# Patient Record
Sex: Female | Born: 1940 | Race: White | Hispanic: No | Marital: Single | Smoking: Never smoker
Health system: Southern US, Community
[De-identification: ages and names within clinical notes are randomized; demographics above are authoritative.]

## PROBLEM LIST (undated history)

## (undated) DIAGNOSIS — I10 Essential (primary) hypertension: Secondary | ICD-10-CM

## (undated) DIAGNOSIS — K2100 Gastro-esophageal reflux disease with esophagitis, without bleeding: Secondary | ICD-10-CM

## (undated) HISTORY — PX: THORACIC DISC SURGERY: SHX801

## (undated) HISTORY — PX: FOOT SURGERY: SHX648

---

## 2015-04-03 DIAGNOSIS — Z5181 Encounter for therapeutic drug level monitoring: Secondary | ICD-10-CM | POA: Diagnosis not present

## 2015-04-09 DIAGNOSIS — M533 Sacrococcygeal disorders, not elsewhere classified: Secondary | ICD-10-CM | POA: Diagnosis not present

## 2015-04-11 DIAGNOSIS — Z79899 Other long term (current) drug therapy: Secondary | ICD-10-CM | POA: Diagnosis not present

## 2015-04-11 DIAGNOSIS — M84374A Stress fracture, right foot, initial encounter for fracture: Secondary | ICD-10-CM | POA: Diagnosis not present

## 2015-04-11 DIAGNOSIS — Z7982 Long term (current) use of aspirin: Secondary | ICD-10-CM | POA: Diagnosis not present

## 2015-04-11 DIAGNOSIS — M722 Plantar fascial fibromatosis: Secondary | ICD-10-CM | POA: Diagnosis not present

## 2015-04-11 DIAGNOSIS — M79671 Pain in right foot: Secondary | ICD-10-CM | POA: Diagnosis not present

## 2015-04-11 DIAGNOSIS — Z885 Allergy status to narcotic agent status: Secondary | ICD-10-CM | POA: Diagnosis not present

## 2015-04-11 DIAGNOSIS — M7731 Calcaneal spur, right foot: Secondary | ICD-10-CM | POA: Diagnosis not present

## 2015-04-11 DIAGNOSIS — M19071 Primary osteoarthritis, right ankle and foot: Secondary | ICD-10-CM | POA: Diagnosis not present

## 2015-04-11 DIAGNOSIS — I10 Essential (primary) hypertension: Secondary | ICD-10-CM | POA: Diagnosis not present

## 2015-04-11 DIAGNOSIS — Z87891 Personal history of nicotine dependence: Secondary | ICD-10-CM | POA: Diagnosis not present

## 2015-04-16 DIAGNOSIS — Z87891 Personal history of nicotine dependence: Secondary | ICD-10-CM | POA: Diagnosis not present

## 2015-04-16 DIAGNOSIS — M84374A Stress fracture, right foot, initial encounter for fracture: Secondary | ICD-10-CM | POA: Diagnosis not present

## 2015-04-16 DIAGNOSIS — X58XXXD Exposure to other specified factors, subsequent encounter: Secondary | ICD-10-CM | POA: Diagnosis not present

## 2015-04-16 DIAGNOSIS — S62602K Fracture of unspecified phalanx of right middle finger, subsequent encounter for fracture with nonunion: Secondary | ICD-10-CM | POA: Diagnosis not present

## 2015-04-16 DIAGNOSIS — S92321K Displaced fracture of second metatarsal bone, right foot, subsequent encounter for fracture with nonunion: Secondary | ICD-10-CM | POA: Diagnosis not present

## 2015-04-16 DIAGNOSIS — M84374K Stress fracture, right foot, subsequent encounter for fracture with nonunion: Secondary | ICD-10-CM | POA: Diagnosis not present

## 2015-04-16 DIAGNOSIS — F419 Anxiety disorder, unspecified: Secondary | ICD-10-CM | POA: Diagnosis not present

## 2015-05-01 DIAGNOSIS — S92321D Displaced fracture of second metatarsal bone, right foot, subsequent encounter for fracture with routine healing: Secondary | ICD-10-CM | POA: Diagnosis not present

## 2015-05-01 DIAGNOSIS — M84374A Stress fracture, right foot, initial encounter for fracture: Secondary | ICD-10-CM | POA: Diagnosis not present

## 2015-05-01 DIAGNOSIS — X58XXXD Exposure to other specified factors, subsequent encounter: Secondary | ICD-10-CM | POA: Diagnosis not present

## 2015-05-24 DIAGNOSIS — Z4789 Encounter for other orthopedic aftercare: Secondary | ICD-10-CM | POA: Diagnosis not present

## 2015-05-24 DIAGNOSIS — Z7982 Long term (current) use of aspirin: Secondary | ICD-10-CM | POA: Diagnosis not present

## 2015-05-24 DIAGNOSIS — Z79899 Other long term (current) drug therapy: Secondary | ICD-10-CM | POA: Diagnosis not present

## 2015-05-24 DIAGNOSIS — X58XXXD Exposure to other specified factors, subsequent encounter: Secondary | ICD-10-CM | POA: Diagnosis not present

## 2015-05-24 DIAGNOSIS — Z87891 Personal history of nicotine dependence: Secondary | ICD-10-CM | POA: Diagnosis not present

## 2015-05-24 DIAGNOSIS — I10 Essential (primary) hypertension: Secondary | ICD-10-CM | POA: Diagnosis not present

## 2015-05-24 DIAGNOSIS — M84374K Stress fracture, right foot, subsequent encounter for fracture with nonunion: Secondary | ICD-10-CM | POA: Diagnosis not present

## 2015-05-24 DIAGNOSIS — Z885 Allergy status to narcotic agent status: Secondary | ICD-10-CM | POA: Diagnosis not present

## 2015-06-20 DIAGNOSIS — M5136 Other intervertebral disc degeneration, lumbar region: Secondary | ICD-10-CM | POA: Diagnosis not present

## 2015-06-20 DIAGNOSIS — M533 Sacrococcygeal disorders, not elsewhere classified: Secondary | ICD-10-CM | POA: Diagnosis not present

## 2015-06-20 DIAGNOSIS — M1288 Other specific arthropathies, not elsewhere classified, other specified site: Secondary | ICD-10-CM | POA: Diagnosis not present

## 2015-06-21 DIAGNOSIS — Z87891 Personal history of nicotine dependence: Secondary | ICD-10-CM | POA: Diagnosis not present

## 2015-06-21 DIAGNOSIS — Z4789 Encounter for other orthopedic aftercare: Secondary | ICD-10-CM | POA: Diagnosis not present

## 2015-06-21 DIAGNOSIS — Z7982 Long term (current) use of aspirin: Secondary | ICD-10-CM | POA: Diagnosis not present

## 2015-06-21 DIAGNOSIS — X58XXXD Exposure to other specified factors, subsequent encounter: Secondary | ICD-10-CM | POA: Diagnosis not present

## 2015-06-21 DIAGNOSIS — Z885 Allergy status to narcotic agent status: Secondary | ICD-10-CM | POA: Diagnosis not present

## 2015-06-21 DIAGNOSIS — M84374K Stress fracture, right foot, subsequent encounter for fracture with nonunion: Secondary | ICD-10-CM | POA: Diagnosis not present

## 2015-06-21 DIAGNOSIS — Z79899 Other long term (current) drug therapy: Secondary | ICD-10-CM | POA: Diagnosis not present

## 2015-06-21 DIAGNOSIS — I10 Essential (primary) hypertension: Secondary | ICD-10-CM | POA: Diagnosis not present

## 2015-07-19 DIAGNOSIS — Z7982 Long term (current) use of aspirin: Secondary | ICD-10-CM | POA: Diagnosis not present

## 2015-07-19 DIAGNOSIS — Z79891 Long term (current) use of opiate analgesic: Secondary | ICD-10-CM | POA: Diagnosis not present

## 2015-07-19 DIAGNOSIS — Z09 Encounter for follow-up examination after completed treatment for conditions other than malignant neoplasm: Secondary | ICD-10-CM | POA: Diagnosis not present

## 2015-07-19 DIAGNOSIS — Z8781 Personal history of (healed) traumatic fracture: Secondary | ICD-10-CM | POA: Diagnosis not present

## 2015-07-19 DIAGNOSIS — Z885 Allergy status to narcotic agent status: Secondary | ICD-10-CM | POA: Diagnosis not present

## 2015-07-19 DIAGNOSIS — I10 Essential (primary) hypertension: Secondary | ICD-10-CM | POA: Diagnosis not present

## 2015-07-19 DIAGNOSIS — Z79899 Other long term (current) drug therapy: Secondary | ICD-10-CM | POA: Diagnosis not present

## 2015-07-19 DIAGNOSIS — M84374K Stress fracture, right foot, subsequent encounter for fracture with nonunion: Secondary | ICD-10-CM | POA: Diagnosis not present

## 2015-07-19 DIAGNOSIS — Z4789 Encounter for other orthopedic aftercare: Secondary | ICD-10-CM | POA: Diagnosis not present

## 2015-07-19 DIAGNOSIS — Z87891 Personal history of nicotine dependence: Secondary | ICD-10-CM | POA: Diagnosis not present

## 2015-07-19 DIAGNOSIS — Z9889 Other specified postprocedural states: Secondary | ICD-10-CM | POA: Diagnosis not present

## 2015-08-30 DIAGNOSIS — K219 Gastro-esophageal reflux disease without esophagitis: Secondary | ICD-10-CM | POA: Diagnosis not present

## 2015-08-30 DIAGNOSIS — F411 Generalized anxiety disorder: Secondary | ICD-10-CM | POA: Diagnosis not present

## 2015-08-30 DIAGNOSIS — F329 Major depressive disorder, single episode, unspecified: Secondary | ICD-10-CM | POA: Diagnosis not present

## 2015-08-30 DIAGNOSIS — I10 Essential (primary) hypertension: Secondary | ICD-10-CM | POA: Diagnosis not present

## 2015-08-30 DIAGNOSIS — M8580 Other specified disorders of bone density and structure, unspecified site: Secondary | ICD-10-CM | POA: Diagnosis not present

## 2015-09-19 DIAGNOSIS — M5136 Other intervertebral disc degeneration, lumbar region: Secondary | ICD-10-CM | POA: Diagnosis not present

## 2015-09-19 DIAGNOSIS — M533 Sacrococcygeal disorders, not elsewhere classified: Secondary | ICD-10-CM | POA: Diagnosis not present

## 2015-09-19 DIAGNOSIS — M1288 Other specific arthropathies, not elsewhere classified, other specified site: Secondary | ICD-10-CM | POA: Diagnosis not present

## 2015-11-26 DIAGNOSIS — Z87891 Personal history of nicotine dependence: Secondary | ICD-10-CM | POA: Diagnosis not present

## 2015-11-26 DIAGNOSIS — Z7982 Long term (current) use of aspirin: Secondary | ICD-10-CM | POA: Diagnosis not present

## 2015-11-26 DIAGNOSIS — Z79899 Other long term (current) drug therapy: Secondary | ICD-10-CM | POA: Diagnosis not present

## 2015-11-26 DIAGNOSIS — M79671 Pain in right foot: Secondary | ICD-10-CM | POA: Diagnosis not present

## 2015-11-26 DIAGNOSIS — M84374D Stress fracture, right foot, subsequent encounter for fracture with routine healing: Secondary | ICD-10-CM | POA: Diagnosis not present

## 2015-11-26 DIAGNOSIS — Z4789 Encounter for other orthopedic aftercare: Secondary | ICD-10-CM | POA: Diagnosis not present

## 2015-11-26 DIAGNOSIS — Z885 Allergy status to narcotic agent status: Secondary | ICD-10-CM | POA: Diagnosis not present

## 2015-11-26 DIAGNOSIS — M722 Plantar fascial fibromatosis: Secondary | ICD-10-CM | POA: Diagnosis not present

## 2015-11-26 DIAGNOSIS — M84374K Stress fracture, right foot, subsequent encounter for fracture with nonunion: Secondary | ICD-10-CM | POA: Diagnosis not present

## 2015-11-26 DIAGNOSIS — I10 Essential (primary) hypertension: Secondary | ICD-10-CM | POA: Diagnosis not present

## 2015-12-09 DIAGNOSIS — I129 Hypertensive chronic kidney disease with stage 1 through stage 4 chronic kidney disease, or unspecified chronic kidney disease: Secondary | ICD-10-CM | POA: Diagnosis not present

## 2015-12-09 DIAGNOSIS — N183 Chronic kidney disease, stage 3 (moderate): Secondary | ICD-10-CM | POA: Diagnosis not present

## 2015-12-09 DIAGNOSIS — F411 Generalized anxiety disorder: Secondary | ICD-10-CM | POA: Diagnosis not present

## 2015-12-09 DIAGNOSIS — I482 Chronic atrial fibrillation: Secondary | ICD-10-CM | POA: Diagnosis not present

## 2015-12-09 DIAGNOSIS — F3341 Major depressive disorder, recurrent, in partial remission: Secondary | ICD-10-CM | POA: Diagnosis not present

## 2015-12-09 DIAGNOSIS — M461 Sacroiliitis, not elsewhere classified: Secondary | ICD-10-CM | POA: Diagnosis not present

## 2015-12-09 DIAGNOSIS — M5417 Radiculopathy, lumbosacral region: Secondary | ICD-10-CM | POA: Diagnosis not present

## 2015-12-09 DIAGNOSIS — Z23 Encounter for immunization: Secondary | ICD-10-CM | POA: Diagnosis not present

## 2015-12-27 DIAGNOSIS — M533 Sacrococcygeal disorders, not elsewhere classified: Secondary | ICD-10-CM | POA: Diagnosis not present

## 2015-12-27 DIAGNOSIS — M1288 Other specific arthropathies, not elsewhere classified, other specified site: Secondary | ICD-10-CM | POA: Diagnosis not present

## 2015-12-27 DIAGNOSIS — M5136 Other intervertebral disc degeneration, lumbar region: Secondary | ICD-10-CM | POA: Diagnosis not present

## 2015-12-27 DIAGNOSIS — Z5181 Encounter for therapeutic drug level monitoring: Secondary | ICD-10-CM | POA: Diagnosis not present

## 2016-01-31 DIAGNOSIS — M84374A Stress fracture, right foot, initial encounter for fracture: Secondary | ICD-10-CM | POA: Diagnosis not present

## 2016-01-31 DIAGNOSIS — Z885 Allergy status to narcotic agent status: Secondary | ICD-10-CM | POA: Diagnosis not present

## 2016-01-31 DIAGNOSIS — I129 Hypertensive chronic kidney disease with stage 1 through stage 4 chronic kidney disease, or unspecified chronic kidney disease: Secondary | ICD-10-CM | POA: Diagnosis not present

## 2016-01-31 DIAGNOSIS — Z7982 Long term (current) use of aspirin: Secondary | ICD-10-CM | POA: Diagnosis not present

## 2016-01-31 DIAGNOSIS — N183 Chronic kidney disease, stage 3 (moderate): Secondary | ICD-10-CM | POA: Diagnosis not present

## 2016-01-31 DIAGNOSIS — M79671 Pain in right foot: Secondary | ICD-10-CM | POA: Diagnosis not present

## 2016-01-31 DIAGNOSIS — M85871 Other specified disorders of bone density and structure, right ankle and foot: Secondary | ICD-10-CM | POA: Diagnosis not present

## 2016-01-31 DIAGNOSIS — X58XXXA Exposure to other specified factors, initial encounter: Secondary | ICD-10-CM | POA: Diagnosis not present

## 2016-01-31 DIAGNOSIS — Z79899 Other long term (current) drug therapy: Secondary | ICD-10-CM | POA: Diagnosis not present

## 2016-05-13 DIAGNOSIS — R29898 Other symptoms and signs involving the musculoskeletal system: Secondary | ICD-10-CM | POA: Diagnosis not present

## 2016-05-13 DIAGNOSIS — G5790 Unspecified mononeuropathy of unspecified lower limb: Secondary | ICD-10-CM | POA: Diagnosis not present

## 2016-05-13 DIAGNOSIS — M79671 Pain in right foot: Secondary | ICD-10-CM | POA: Diagnosis not present

## 2016-05-13 DIAGNOSIS — M19079 Primary osteoarthritis, unspecified ankle and foot: Secondary | ICD-10-CM | POA: Diagnosis not present

## 2016-07-30 ENCOUNTER — Encounter (HOSPITAL_COMMUNITY)
Admission: EM | Disposition: A | Discharge status: Home / Self Care | Payer: Self-pay | Source: Home / Self Care | Attending: Internal Medicine

## 2016-07-30 ENCOUNTER — Inpatient Hospital Stay (HOSPITAL_COMMUNITY): Payer: PPO

## 2016-07-30 ENCOUNTER — Inpatient Hospital Stay (HOSPITAL_COMMUNITY): Payer: PPO | Admitting: Certified Registered Nurse Anesthetist

## 2016-07-30 ENCOUNTER — Inpatient Hospital Stay (HOSPITAL_BASED_OUTPATIENT_CLINIC_OR_DEPARTMENT_OTHER)
Admission: EM | Admit: 2016-07-30 | Discharge: 2016-08-01 | DRG: 482 | Disposition: A | Discharge status: Home / Self Care | Payer: PPO | Attending: Internal Medicine | Admitting: Internal Medicine

## 2016-07-30 ENCOUNTER — Emergency Department (HOSPITAL_BASED_OUTPATIENT_CLINIC_OR_DEPARTMENT_OTHER): Payer: PPO

## 2016-07-30 ENCOUNTER — Encounter (HOSPITAL_BASED_OUTPATIENT_CLINIC_OR_DEPARTMENT_OTHER): Payer: Self-pay | Admitting: *Deleted

## 2016-07-30 DIAGNOSIS — I480 Paroxysmal atrial fibrillation: Secondary | ICD-10-CM | POA: Diagnosis not present

## 2016-07-30 DIAGNOSIS — W1830XA Fall on same level, unspecified, initial encounter: Secondary | ICD-10-CM | POA: Diagnosis present

## 2016-07-30 DIAGNOSIS — E876 Hypokalemia: Secondary | ICD-10-CM | POA: Diagnosis not present

## 2016-07-30 DIAGNOSIS — R51 Headache: Secondary | ICD-10-CM | POA: Diagnosis present

## 2016-07-30 DIAGNOSIS — M1631 Unilateral osteoarthritis resulting from hip dysplasia, right hip: Secondary | ICD-10-CM | POA: Diagnosis not present

## 2016-07-30 DIAGNOSIS — S72091A Other fracture of head and neck of right femur, initial encounter for closed fracture: Secondary | ICD-10-CM | POA: Diagnosis not present

## 2016-07-30 DIAGNOSIS — I129 Hypertensive chronic kidney disease with stage 1 through stage 4 chronic kidney disease, or unspecified chronic kidney disease: Secondary | ICD-10-CM | POA: Diagnosis not present

## 2016-07-30 DIAGNOSIS — N183 Chronic kidney disease, stage 3 unspecified: Secondary | ICD-10-CM | POA: Diagnosis present

## 2016-07-30 DIAGNOSIS — Z8249 Family history of ischemic heart disease and other diseases of the circulatory system: Secondary | ICD-10-CM | POA: Diagnosis not present

## 2016-07-30 DIAGNOSIS — M25551 Pain in right hip: Secondary | ICD-10-CM | POA: Diagnosis not present

## 2016-07-30 DIAGNOSIS — K210 Gastro-esophageal reflux disease with esophagitis: Secondary | ICD-10-CM | POA: Diagnosis present

## 2016-07-30 DIAGNOSIS — F419 Anxiety disorder, unspecified: Secondary | ICD-10-CM | POA: Diagnosis present

## 2016-07-30 DIAGNOSIS — S72011A Unspecified intracapsular fracture of right femur, initial encounter for closed fracture: Secondary | ICD-10-CM | POA: Diagnosis not present

## 2016-07-30 DIAGNOSIS — I10 Essential (primary) hypertension: Secondary | ICD-10-CM | POA: Diagnosis not present

## 2016-07-30 DIAGNOSIS — F329 Major depressive disorder, single episode, unspecified: Secondary | ICD-10-CM | POA: Diagnosis not present

## 2016-07-30 DIAGNOSIS — R52 Pain, unspecified: Secondary | ICD-10-CM

## 2016-07-30 DIAGNOSIS — Z419 Encounter for procedure for purposes other than remedying health state, unspecified: Secondary | ICD-10-CM

## 2016-07-30 DIAGNOSIS — D72829 Elevated white blood cell count, unspecified: Secondary | ICD-10-CM | POA: Diagnosis not present

## 2016-07-30 DIAGNOSIS — S72009A Fracture of unspecified part of neck of unspecified femur, initial encounter for closed fracture: Secondary | ICD-10-CM | POA: Diagnosis present

## 2016-07-30 DIAGNOSIS — S72001A Fracture of unspecified part of neck of right femur, initial encounter for closed fracture: Secondary | ICD-10-CM | POA: Diagnosis not present

## 2016-07-30 DIAGNOSIS — Z885 Allergy status to narcotic agent status: Secondary | ICD-10-CM | POA: Diagnosis not present

## 2016-07-30 DIAGNOSIS — Z79899 Other long term (current) drug therapy: Secondary | ICD-10-CM | POA: Diagnosis not present

## 2016-07-30 DIAGNOSIS — D500 Iron deficiency anemia secondary to blood loss (chronic): Secondary | ICD-10-CM | POA: Diagnosis present

## 2016-07-30 HISTORY — DX: Essential (primary) hypertension: I10

## 2016-07-30 HISTORY — DX: Gastro-esophageal reflux disease with esophagitis: K21.0

## 2016-07-30 HISTORY — DX: Gastro-esophageal reflux disease with esophagitis, without bleeding: K21.00

## 2016-07-30 HISTORY — PX: HIP PINNING,CANNULATED: SHX1758

## 2016-07-30 LAB — CBC WITH DIFFERENTIAL/PLATELET
BASOS ABS: 0 10*3/uL (ref 0.0–0.1)
Basophils Relative: 0 %
EOS ABS: 0.1 10*3/uL (ref 0.0–0.7)
EOS PCT: 1 %
HCT: 37 % (ref 36.0–46.0)
Hemoglobin: 12.9 g/dL (ref 12.0–15.0)
LYMPHS PCT: 29 %
Lymphs Abs: 2.2 10*3/uL (ref 0.7–4.0)
MCH: 32.5 pg (ref 26.0–34.0)
MCHC: 34.9 g/dL (ref 30.0–36.0)
MCV: 93.2 fL (ref 78.0–100.0)
MONO ABS: 0.5 10*3/uL (ref 0.1–1.0)
Monocytes Relative: 6 %
Neutro Abs: 4.7 10*3/uL (ref 1.7–7.7)
Neutrophils Relative %: 64 %
PLATELETS: 288 10*3/uL (ref 150–400)
RBC: 3.97 MIL/uL (ref 3.87–5.11)
RDW: 12.1 % (ref 11.5–15.5)
WBC: 7.4 10*3/uL (ref 4.0–10.5)

## 2016-07-30 LAB — BASIC METABOLIC PANEL
Anion gap: 9 (ref 5–15)
BUN: 33 mg/dL — ABNORMAL HIGH (ref 6–20)
CALCIUM: 10 mg/dL (ref 8.9–10.3)
CO2: 26 mmol/L (ref 22–32)
CREATININE: 1.51 mg/dL — AB (ref 0.44–1.00)
Chloride: 100 mmol/L — ABNORMAL LOW (ref 101–111)
GFR calc Af Amer: 38 mL/min — ABNORMAL LOW (ref >60)
GFR, EST NON AFRICAN AMERICAN: 33 mL/min — AB (ref >60)
GLUCOSE: 103 mg/dL — AB (ref 65–99)
Potassium: 3.1 mmol/L — ABNORMAL LOW (ref 3.5–5.1)
SODIUM: 135 mmol/L (ref 135–145)

## 2016-07-30 LAB — PROTIME-INR
INR: 1
PROTHROMBIN TIME: 13.2 s (ref 11.4–15.2)

## 2016-07-30 SURGERY — FIXATION, FEMUR, NECK, PERCUTANEOUS, USING SCREW
Anesthesia: General | Laterality: Right

## 2016-07-30 MED ORDER — NEOSTIGMINE METHYLSULFATE 5 MG/5ML IV SOSY
PREFILLED_SYRINGE | INTRAVENOUS | Status: AC
  Filled 2016-07-30: qty 5

## 2016-07-30 MED ORDER — METHOCARBAMOL 500 MG PO TABS: 500.0000 mg | ORAL_TABLET | Freq: Four times a day (QID) | ORAL | Status: DC | PRN

## 2016-07-30 MED ORDER — PROPOFOL 10 MG/ML IV BOLUS
INTRAVENOUS | Status: AC
  Filled 2016-07-30: qty 20

## 2016-07-30 MED ORDER — LIDOCAINE 2% (20 MG/ML) 5 ML SYRINGE
INTRAMUSCULAR | Status: DC | PRN
  Administered 2016-07-30: 60 mg via INTRAVENOUS

## 2016-07-30 MED ORDER — NEOSTIGMINE METHYLSULFATE 10 MG/10ML IV SOLN
INTRAVENOUS | Status: DC | PRN
  Administered 2016-07-30: 2 mg via INTRAVENOUS

## 2016-07-30 MED ORDER — HYDROCODONE-ACETAMINOPHEN 5-325 MG PO TABS
ORAL_TABLET | ORAL | Status: AC
  Filled 2016-07-30: qty 1

## 2016-07-30 MED ORDER — FENTANYL CITRATE (PF) 100 MCG/2ML IJ SOLN
INTRAMUSCULAR | Status: AC
Start: 2016-07-30 — End: 2016-07-31
  Filled 2016-07-30: qty 2

## 2016-07-30 MED ORDER — GLYCOPYRROLATE 0.2 MG/ML IV SOSY
PREFILLED_SYRINGE | INTRAVENOUS | Status: AC
  Filled 2016-07-30: qty 5

## 2016-07-30 MED ORDER — ROCURONIUM BROMIDE 10 MG/ML (PF) SYRINGE
PREFILLED_SYRINGE | INTRAVENOUS | Status: DC | PRN
  Administered 2016-07-30: 20 mg via INTRAVENOUS

## 2016-07-30 MED ORDER — ONDANSETRON HCL 4 MG PO TABS
4.0000 mg | ORAL_TABLET | Freq: Four times a day (QID) | ORAL | Status: DC | PRN
  Filled 2016-07-30: qty 1

## 2016-07-30 MED ORDER — ONDANSETRON HCL 4 MG/2ML IJ SOLN
INTRAMUSCULAR | Status: DC | PRN
  Administered 2016-07-30: 4 mg via INTRAVENOUS

## 2016-07-30 MED ORDER — METHOCARBAMOL 1000 MG/10ML IJ SOLN
500.0000 mg | Freq: Four times a day (QID) | INTRAVENOUS | Status: DC | PRN
  Administered 2016-07-30: 500 mg via INTRAVENOUS
  Filled 2016-07-30: qty 550

## 2016-07-30 MED ORDER — ACETAMINOPHEN 325 MG PO TABS: 650.0000 mg | ORAL_TABLET | Freq: Four times a day (QID) | ORAL | Status: DC | PRN

## 2016-07-30 MED ORDER — FENTANYL CITRATE (PF) 100 MCG/2ML IJ SOLN
50.0000 ug | Freq: Once | INTRAMUSCULAR | Status: AC
  Administered 2016-07-30: 50 ug via INTRAVENOUS
  Filled 2016-07-30: qty 2

## 2016-07-30 MED ORDER — FENTANYL CITRATE (PF) 100 MCG/2ML IJ SOLN
INTRAMUSCULAR | Status: DC | PRN
  Administered 2016-07-30 (×2): 50 ug via INTRAVENOUS

## 2016-07-30 MED ORDER — ONDANSETRON HCL 4 MG/2ML IJ SOLN
INTRAMUSCULAR | Status: AC
  Filled 2016-07-30: qty 2

## 2016-07-30 MED ORDER — GLYCOPYRROLATE 0.2 MG/ML IV SOSY
PREFILLED_SYRINGE | INTRAVENOUS | Status: DC | PRN
  Administered 2016-07-30 (×2): .2 mg via INTRAVENOUS

## 2016-07-30 MED ORDER — HYDRALAZINE HCL 20 MG/ML IJ SOLN: 10.0000 mg | INTRAMUSCULAR | Status: DC | PRN

## 2016-07-30 MED ORDER — FENTANYL CITRATE (PF) 100 MCG/2ML IJ SOLN
INTRAMUSCULAR | Status: AC
  Filled 2016-07-30: qty 2

## 2016-07-30 MED ORDER — SUCCINYLCHOLINE CHLORIDE 200 MG/10ML IV SOSY
PREFILLED_SYRINGE | INTRAVENOUS | Status: DC | PRN
  Administered 2016-07-30: 100 mg via INTRAVENOUS

## 2016-07-30 MED ORDER — ACETAMINOPHEN 650 MG RE SUPP
650.0000 mg | Freq: Four times a day (QID) | RECTAL | Status: DC | PRN
  Filled 2016-07-30: qty 1

## 2016-07-30 MED ORDER — CEFAZOLIN SODIUM-DEXTROSE 2-3 GM-% IV SOLR
INTRAVENOUS | Status: DC | PRN
  Administered 2016-07-30: 2 g via INTRAVENOUS

## 2016-07-30 MED ORDER — MAGNESIUM CITRATE PO SOLN: 1.0000 | Freq: Once | ORAL | Status: DC | PRN

## 2016-07-30 MED ORDER — LACTATED RINGERS IV SOLN
INTRAVENOUS | Status: DC | PRN
  Administered 2016-07-30 (×2): via INTRAVENOUS

## 2016-07-30 MED ORDER — DEXAMETHASONE SODIUM PHOSPHATE 10 MG/ML IJ SOLN
INTRAMUSCULAR | Status: AC
  Filled 2016-07-30: qty 1

## 2016-07-30 MED ORDER — DEXAMETHASONE SODIUM PHOSPHATE 10 MG/ML IJ SOLN
INTRAMUSCULAR | Status: DC | PRN
  Administered 2016-07-30: 10 mg via INTRAVENOUS

## 2016-07-30 MED ORDER — DOCUSATE SODIUM 100 MG PO CAPS
100.0000 mg | ORAL_CAPSULE | Freq: Two times a day (BID) | ORAL | Status: DC
  Administered 2016-07-31 – 2016-08-01 (×4): 100 mg via ORAL
  Filled 2016-07-30 (×5): qty 1

## 2016-07-30 MED ORDER — EPHEDRINE 5 MG/ML INJ
INTRAVENOUS | Status: AC
  Filled 2016-07-30: qty 10

## 2016-07-30 MED ORDER — ROCURONIUM BROMIDE 50 MG/5ML IV SOSY
PREFILLED_SYRINGE | INTRAVENOUS | Status: AC
  Filled 2016-07-30: qty 5

## 2016-07-30 MED ORDER — CEFAZOLIN SODIUM-DEXTROSE 2-4 GM/100ML-% IV SOLN
INTRAVENOUS | Status: AC
  Filled 2016-07-30: qty 100

## 2016-07-30 MED ORDER — HYDROCODONE-ACETAMINOPHEN 5-325 MG PO TABS
1.0000 | ORAL_TABLET | Freq: Four times a day (QID) | ORAL | Status: DC | PRN
  Administered 2016-07-30: 1 via ORAL
  Administered 2016-07-31 – 2016-08-01 (×4): 2 via ORAL
  Filled 2016-07-30 (×4): qty 2

## 2016-07-30 MED ORDER — POLYETHYLENE GLYCOL 3350 17 G PO PACK: 17.0000 g | PACK | Freq: Every day | ORAL | Status: DC | PRN

## 2016-07-30 MED ORDER — SUGAMMADEX SODIUM 200 MG/2ML IV SOLN
INTRAVENOUS | Status: AC
  Filled 2016-07-30: qty 2

## 2016-07-30 MED ORDER — PROPOFOL 10 MG/ML IV BOLUS
INTRAVENOUS | Status: DC | PRN
  Administered 2016-07-30: 130 mg via INTRAVENOUS

## 2016-07-30 MED ORDER — HYDROCODONE-ACETAMINOPHEN 5-325 MG PO TABS: 1.0000 | ORAL_TABLET | Freq: Four times a day (QID) | ORAL | 0 refills | Status: DC | PRN

## 2016-07-30 MED ORDER — CEFAZOLIN SODIUM-DEXTROSE 2-4 GM/100ML-% IV SOLN
2.0000 g | Freq: Four times a day (QID) | INTRAVENOUS | Status: AC
  Administered 2016-07-31 (×2): 2 g via INTRAVENOUS
  Filled 2016-07-30 (×2): qty 100

## 2016-07-30 MED ORDER — ONDANSETRON HCL 4 MG/2ML IJ SOLN: 4.0000 mg | Freq: Four times a day (QID) | INTRAMUSCULAR | Status: DC | PRN

## 2016-07-30 MED ORDER — SODIUM CHLORIDE 0.9 % IV BOLUS (SEPSIS)
1000.0000 mL | Freq: Once | INTRAVENOUS | Status: AC
  Administered 2016-07-30: 1000 mL via INTRAVENOUS

## 2016-07-30 MED ORDER — PROMETHAZINE HCL 25 MG/ML IJ SOLN
6.2500 mg | INTRAMUSCULAR | Status: DC | PRN
Start: 2016-07-30 — End: 2016-07-31

## 2016-07-30 MED ORDER — FENTANYL CITRATE (PF) 100 MCG/2ML IJ SOLN
25.0000 ug | INTRAMUSCULAR | Status: DC | PRN
Start: 2016-07-30 — End: 2016-07-31
  Administered 2016-07-30: 25 ug via INTRAVENOUS
  Administered 2016-07-30: 50 ug via INTRAVENOUS
  Administered 2016-07-30: 25 ug via INTRAVENOUS
  Administered 2016-07-30: 50 ug via INTRAVENOUS

## 2016-07-30 MED ORDER — EPHEDRINE SULFATE 50 MG/ML IJ SOLN
INTRAMUSCULAR | Status: DC | PRN
  Administered 2016-07-30 (×4): 10 mg via INTRAVENOUS

## 2016-07-30 MED ORDER — ASPIRIN EC 325 MG PO TBEC: 325.0000 mg | DELAYED_RELEASE_TABLET | Freq: Every day | ORAL | 0 refills | Status: DC

## 2016-07-30 MED ORDER — BISACODYL 5 MG PO TBEC: 5.0000 mg | DELAYED_RELEASE_TABLET | Freq: Every day | ORAL | Status: DC | PRN

## 2016-07-30 SURGICAL SUPPLY — 34 items
BAG ZIPLOCK 12X15 (MISCELLANEOUS) IMPLANT
BIT DRILL 4.8X300 (BIT) ×2 IMPLANT
BIT DRILL 4.8X300MM (BIT) ×1
BNDG GAUZE ELAST 4 BULKY (GAUZE/BANDAGES/DRESSINGS) IMPLANT
CLOSURE WOUND 1/2 X4 (GAUZE/BANDAGES/DRESSINGS) ×1
DRAPE STERI IOBAN 125X83 (DRAPES) ×3 IMPLANT
DRSG EMULSION OIL 3X16 NADH (GAUZE/BANDAGES/DRESSINGS) IMPLANT
DRSG MEPILEX BORDER 4X4 (GAUZE/BANDAGES/DRESSINGS) ×3 IMPLANT
DRSG PAD ABDOMINAL 8X10 ST (GAUZE/BANDAGES/DRESSINGS) IMPLANT
DURAPREP 26ML APPLICATOR (WOUND CARE) ×3 IMPLANT
ELECT REM PT RETURN 15FT ADLT (MISCELLANEOUS) ×3 IMPLANT
GAUZE SPONGE 4X4 12PLY STRL (GAUZE/BANDAGES/DRESSINGS) IMPLANT
GLOVE BIO SURGEON STRL SZ8 (GLOVE) ×3 IMPLANT
GLOVE ECLIPSE 7.5 STRL STRAW (GLOVE) ×18 IMPLANT
MANIFOLD NEPTUNE II (INSTRUMENTS) ×3 IMPLANT
NS IRRIG 1000ML POUR BTL (IV SOLUTION) ×3 IMPLANT
PACK GENERAL/GYN (CUSTOM PROCEDURE TRAY) ×3 IMPLANT
PAD CAST 4YDX4 CTTN HI CHSV (CAST SUPPLIES) IMPLANT
PADDING CAST COTTON 4X4 STRL (CAST SUPPLIES)
PIN GUIDE DRILL TIP 2.8X300 (DRILL) ×9 IMPLANT
POSITIONER SURGICAL ARM (MISCELLANEOUS) ×3 IMPLANT
SCREW CANN 6.5MMX100MMX16MM ×3 IMPLANT
SCREW CANN 8.0X85 HIP (Screw) ×3 IMPLANT
SCREW CANN FT 95X8 NS LNG (Screw) ×1 IMPLANT
SCREW CANNULATED 8.0X95 (Screw) ×2 IMPLANT
SPONGE LAP 18X18 X RAY DECT (DISPOSABLE) IMPLANT
STAPLER VISISTAT 35W (STAPLE) ×3 IMPLANT
STRIP CLOSURE SKIN 1/2X4 (GAUZE/BANDAGES/DRESSINGS) ×2 IMPLANT
SUT MNCRL AB 3-0 PS2 18 (SUTURE) ×3 IMPLANT
SUT VIC AB 1 CT1 36 (SUTURE) ×3 IMPLANT
SUT VIC AB 2-0 CT1 27 (SUTURE) ×2
SUT VIC AB 2-0 CT1 TAPERPNT 27 (SUTURE) ×1 IMPLANT
TRAY FOLEY W/METER SILVER 16FR (SET/KITS/TRAYS/PACK) IMPLANT
WATER STERILE IRR 1500ML POUR (IV SOLUTION) IMPLANT

## 2016-07-30 NOTE — Transfer of Care (Signed)
Immediate Anesthesia Transfer of Care Note  Patient: Jamie PhilipsMary Clevinger  Procedure(s) Performed: Procedure(s): CANNULATED HIP PINNING (Right)  Patient Location: PACU  Anesthesia Type:General  Level of Consciousness:  sedated, patient cooperative and responds to stimulation  Airway & Oxygen Therapy:Patient Spontanous Breathing and Patient connected to face mask oxgen  Post-op Assessment:  Report given to PACU RN and Post -op Vital signs reviewed and stable  Post vital signs:  Reviewed and stable  Last Vitals:  Vitals:   07/30/16 1839 07/30/16 2230  BP: (!) 159/68   Pulse: 72   Resp: 17 17  Temp:  36.6 C    Complications: No apparent anesthesia complications

## 2016-07-30 NOTE — Anesthesia Preprocedure Evaluation (Addendum)
Anesthesia Evaluation  Patient identified by MRN, date of birth, ID band Patient awake    Reviewed: Allergy & Precautions, NPO status , Patient's Chart, lab work & pertinent test results  Airway Mallampati: II  TM Distance: >3 FB Neck ROM: Full    Dental  (+) Dental Advisory Given   Pulmonary neg pulmonary ROS,    breath sounds clear to auscultation       Cardiovascular hypertension,  Rhythm:Regular Rate:Normal     Neuro/Psych negative neurological ROS     GI/Hepatic negative GI ROS, Neg liver ROS,   Endo/Other  negative endocrine ROS  Renal/GU Renal InsufficiencyRenal disease     Musculoskeletal   Abdominal   Peds  Hematology negative hematology ROS (+)   Anesthesia Other Findings   Reproductive/Obstetrics                             Lab Results  Component Value Date   WBC 7.4 07/30/2016   HGB 12.9 07/30/2016   HCT 37.0 07/30/2016   MCV 93.2 07/30/2016   PLT 288 07/30/2016   Lab Results  Component Value Date   CREATININE 1.51 (H) 07/30/2016   BUN 33 (H) 07/30/2016   NA 135 07/30/2016   K 3.1 (L) 07/30/2016   CL 100 (L) 07/30/2016   CO2 26 07/30/2016    Anesthesia Physical Anesthesia Plan  ASA: III  Anesthesia Plan: General   Post-op Pain Management:    Induction: Intravenous  Airway Management Planned: Oral ETT  Additional Equipment:   Intra-op Plan:   Post-operative Plan: Extubation in OR  Informed Consent: I have reviewed the patients History and Physical, chart, labs and discussed the procedure including the risks, benefits and alternatives for the proposed anesthesia with the patient or authorized representative who has indicated his/her understanding and acceptance.   Dental advisory given  Plan Discussed with: CRNA  Anesthesia Plan Comments:         Anesthesia Quick Evaluation

## 2016-07-30 NOTE — ED Triage Notes (Signed)
Patient states she came out of her bedroom and lost her balance and fell.  States she landed on her right hip.  States she continues to have pain in the right trochanter, groin and upper leg.  Problems with her gait and is using a cane to get around.  Was not seen for the fall.

## 2016-07-30 NOTE — ED Notes (Signed)
ED Provider at bedside. 

## 2016-07-30 NOTE — Anesthesia Procedure Notes (Signed)
Procedure Name: Intubation Date/Time: 07/30/2016 8:55 PM Performed by: Vanessa DurhamOCHRAN, Kayle Correa GLENN Pre-anesthesia Checklist: Patient identified, Emergency Drugs available, Suction available and Patient being monitored Patient Re-evaluated:Patient Re-evaluated prior to inductionOxygen Delivery Method: Circle system utilized Preoxygenation: Pre-oxygenation with 100% oxygen Intubation Type: IV induction Ventilation: Mask ventilation without difficulty Laryngoscope Size: 2 and Miller Grade View: Grade I Tube type: Oral Laser Tube: Cuffed inflated with minimal occlusive pressure - saline Tube size: 7.0 mm Number of attempts: 1 Airway Equipment and Method: Stylet Placement Confirmation: ETT inserted through vocal cords under direct vision,  positive ETCO2 and breath sounds checked- equal and bilateral Secured at: 22 cm Tube secured with: Tape Dental Injury: Teeth and Oropharynx as per pre-operative assessment

## 2016-07-30 NOTE — Discharge Instructions (Signed)
Touch down weight bearing on right leg.

## 2016-07-30 NOTE — ED Provider Notes (Signed)
[facility]   CSN: [id number] Arrival date & time: 07/30/16  1340     History   Chief Complaint Chief Complaint  Patient presents with  . Hip Pain    HPI Jamie Buckley is a 76 y.o. female.  HPI   Pt with hx HTN p/w right hip pain that has gradually worsened over the past 2 weeks since fall.  States she has bad balance at baseline, was stepping over a carpet edge at the doorway where there is a change in floor height and fell on her right hip.  Has used ice and ibuprofen, is walking with a cane but continues to have worsening pain.  NO other injury.  No head injury or LOC.  No weakness or numbness of the leg.  Does not have orthopedist.   Last ate at 8am.  Past Medical History:  Diagnosis Date  . Hypertension   . Reflux esophagitis     There are no active problems to display for this patient.   Past Surgical History:  Procedure Laterality Date  . FOOT SURGERY     pinning 2nd metatarsal  . THORACIC DISC SURGERY     s/p fall 12th thoracic fracture.     OB History    No data available       Home Medications    Prior to Admission medications   Medication Sig Start Date End Date Taking? Authorizing Provider  Calcium-Vitamins C & D (CALCIUM/C/D PO) Take by mouth.   Yes [provider]  cholecalciferol (VITAMIN D) 1000 units tablet Take 1,000 Units by mouth daily.   Yes [provider]  escitalopram (LEXAPRO) 10 MG tablet Take 10 mg by mouth daily.   Yes [provider]  meloxicam (MOBIC) 15 MG tablet Take 15 mg by mouth daily.   Yes [provider]  methocarbamol (ROBAXIN) 500 MG tablet Take 500 mg by mouth 4 (four) times daily.   Yes [provider]  traMADol (ULTRAM) 50 MG tablet Take by mouth every 6 (six) hours as needed.   Yes [provider]  UNKNOWN TO PATIENT Blood Pressure pill with diuretic   Yes [provider]    Family History No family history on file.  Social  History Social History  Substance Use Topics  . Smoking status: Never Smoker  . Smokeless tobacco: Never Used  . Alcohol use No     Allergies   Morphine and related   Review of Systems Review of Systems  All other systems reviewed and are negative.    Physical Exam Updated Vital Signs BP 133/68 (BP Location: Right Arm)   Pulse 70   Temp 97.9 F (36.6 C) (Oral)   Resp 16   Ht 5\' 7"  (1.702 m)   Wt 180 lb (81.6 kg)   SpO2 100%   BMI 28.19 kg/m   Physical Exam  Constitutional: She appears well-developed and well-nourished. No distress.  HENT:  Head: Normocephalic and atraumatic.  Neck: Neck supple.  Cardiovascular: Normal rate and regular rhythm.   Pulmonary/Chest: Effort normal and breath sounds normal. No respiratory distress. She has no wheezes. She has no rales.  Abdominal: Soft. She exhibits no distension and no mass. There is no tenderness. There is no rebound and no guarding.  Musculoskeletal:       Legs: Right hip pain with passive ROM.  Pt able to lift at hip.  Distal pulses and sensation intact.  No edema.  No break in skin or  discoloration.   Neurological: She is alert.  Skin: She is not diaphoretic.  Nursing note and vitals reviewed.    ED Treatments / Results  Labs (all labs ordered are listed, but only abnormal results are displayed) Labs Reviewed  CBC WITH DIFFERENTIAL/PLATELET  BASIC METABOLIC PANEL  PROTIME-INR    EKG  EKG Interpretation None       Radiology Dg Hip Unilat With Pelvis 2-3 Views Right  Result Date: 07/30/2016 CLINICAL DATA:  Larey SeatFell 2 weeks ago; inability to bear full weight. EXAM: DG HIP (WITH OR WITHOUT PELVIS) 2-3V RIGHT COMPARISON:  None. FINDINGS: There is a subcapital fracture of the right hip. There is no pelvic fracture. The left hip is grossly normal. There are degenerative changes of the lower lumbar spine. IMPRESSION: There is a subcapital fracture of the right hip. Electronically Signed   By: David  Swaziland[place] M.D.    On: 07/30/2016 16:34    Procedures Procedures (including critical care time)  Medications Ordered in ED Medications - No data to display   Initial Impression / Assessment and Plan / ED Course  I have reviewed the triage vital signs and the nursing notes.  Pertinent labs & imaging results that were available during my care of the patient were reviewed by me and considered in my medical decision making (see chart for details).  Clinical Course as of Jul 30 1721  Thu Jul 30, 2016  1709 I spoke with Dr Luiz BlareGraves, orthopedics, who will plan for OR today.  NPO now.  Will page [facility] when blood work resulted.    [EW]    Clinical Course User Index [EW] ChadWest, Lakiah Dhingra, [place]-C    Afebrile, nontoxic patient with injury to her right hip after fall two weeks ago.   Xray demonstrates subcapital right hip fracture.  Discussed with Dr Luiz BlareGraves who will plan for OR today.  Pending labs so we can contact [facility] hospitalist for admission.  Dr Rush Landmarkegeler will also see the patient, [facility] when labs result.   Final Clinical Impressions(s) / ED Diagnoses   Final diagnoses:  Pain  Closed right hip fracture, initial encounter [facility](HCC)    New Prescriptions New Prescriptions   No medications on file     Trixie DredgeWest, Laurajean Hosek, Cordelia Poche-C 07/30/16 1723    [address]West, Roel Douthat, PA-C 07/30/16 1739    Tegeler, Canary Brimhristopher J, MD 08/11/16 [phone]

## 2016-07-30 NOTE — Consult Note (Signed)
Reason for Consult:right hip pain Referring Physician: hospitalists  Annick Dimaio is an 76 y.o. female.  HPI: the patient is a 76 year old female who fell at home a couple of weeks ago. She has been having significant right hip pain over that period of time. Her pain began to get significant only worse today. She presented to [facility] with severe pain in her right hip. X-rays showed femoral neck fracture impacted in valgus. She felt that she wanted to have surgical intervention for this hip fracture and we are consult for management of this hip fracture.  Past Medical History:  Diagnosis Date  . Hypertension   . Reflux esophagitis     Past Surgical History:  Procedure Laterality Date  . FOOT SURGERY     pinning 2nd metatarsal  . THORACIC DISC SURGERY     s/p fall 12th thoracic fracture.     No family history on file.  Social History:  reports that she has never smoked. She has never used smokeless tobacco. She reports that she does not drink alcohol or use drugs.  Allergies:  Allergies  Allergen Reactions  . Morphine And Related   . Dilaudid [Hydromorphone Hcl] Anxiety    Hallucinations     Medications: I have reviewed the patient's current medications.  Results for orders placed or performed during the hospital encounter of 07/30/16 (from the past 48 hour(s))  Basic metabolic panel     Status: Abnormal   Collection Time: 07/30/16  5:02 PM  Result Value Ref Range   Sodium 135 135 - 145 mmol/L   Potassium 3.1 (L) 3.5 - 5.1 mmol/L   Chloride 100 (L) 101 - 111 mmol/L   CO2 26 22 - 32 mmol/L   Glucose, Bld 103 (H) 65 - 99 mg/dL   BUN 33 (H) 6 - 20 mg/dL   Creatinine, Ser 1.51 (H) 0.44 - 1.00 mg/dL   Calcium 10.0 8.9 - 10.3 mg/dL   GFR calc non Af Amer 33 (L) >60 mL/min   GFR calc Af Amer 38 (L) >60 mL/min    Comment: (NOTE) The eGFR has been calculated using the CKD EPI equation. This calculation has not been validated in all clinical situations. eGFR's  persistently <60 mL/min signify possible Chronic Kidney Disease.    Anion gap 9 5 - 15  CBC with Differential     Status: None   Collection Time: 07/30/16  5:02 PM  Result Value Ref Range   WBC 7.4 4.0 - 10.5 K/uL   RBC 3.97 3.87 - 5.11 MIL/uL   Hemoglobin 12.9 12.0 - 15.0 g/dL   HCT 37.0 36.0 - 46.0 %   MCV 93.2 78.0 - 100.0 fL   MCH 32.5 26.0 - 34.0 pg   MCHC 34.9 30.0 - 36.0 g/dL   RDW 12.1 11.5 - 15.5 %   Platelets 288 150 - 400 K/uL   Neutrophils Relative % 64 %   Neutro Abs 4.7 1.7 - 7.7 K/uL   Lymphocytes Relative 29 %   Lymphs Abs 2.2 0.7 - 4.0 K/uL   Monocytes Relative 6 %   Monocytes Absolute 0.5 0.1 - 1.0 K/uL   Eosinophils Relative 1 %   Eosinophils Absolute 0.1 0.0 - 0.7 K/uL   Basophils Relative 0 %   Basophils Absolute 0.0 0.0 - 0.1 K/uL  Protime-INR     Status: None   Collection Time: 07/30/16  5:02 PM  Result Value Ref Range   Prothrombin Time 13.2 11.4 - 15.2 seconds  INR 1.00     Dg Hip Unilat With Pelvis 2-3 Views Right  Result Date: 07/30/2016 CLINICAL DATA:  [place] 2 weeks ago; inability to bear full weight. EXAM: DG HIP (WITH OR WITHOUT PELVIS) 2-3V RIGHT COMPARISON:  None. FINDINGS: There is a subcapital fracture of the right hip. There is no pelvic fracture. The left hip is grossly normal. There are degenerative changes of the lower lumbar spine. IMPRESSION: There is a subcapital fracture of the right hip. Electronically Signed   By: David  Martinique M.D.   On: 07/30/2016 16:34    ROS  ROS: I have reviewed the patient's review of systems thoroughly and there are no positive responses as relates to the HPI. Blood pressure (!) 159/68, pulse 72, temperature 97.9 F (36.6 C), temperature source Oral, resp. rate 17, height 5' 7"  (1.702 m), weight 81.6 kg (180 lb), SpO2 99 %. Physical Exam Well-developed well-nourished patient in no acute distress. Alert and oriented x3 HEENT:within normal limits Cardiac: Regular rate and rhythm Pulmonary: Lungs clear to  auscultation Abdomen: Soft and nontender.  Normal active bowel sounds  Musculoskeletal: (right hip: Pain with internal and external rotation. Pain with flexion and extension. Neurovascularly intact distally.)  X-ray: X-ray shows impacted femoral neck fracture in valgus. Assessment/Plan: Impacted femoral neck fracture 44 weeks old.//We had a long discussion of treatment options but at this point I feel that the patient needs cannulated screw fixation of her impacted femoral neck fracture. Options of nonoperative treatment were discussed with the patient but she felt that her pain was such that she needed surgical intervention. The patient understood the risks of bleeding, infection, need for further surgery, and failure of internal fixation. She understood the slight risk of death and and around the time of surgery. She wished to proceed in the face of these risks.  The patient will be evaluated and cleared by internal medicine and once this is done she'll be taken operating room for cannulated screw fixation.  Kellianne Ek L 07/30/2016, 7:36 PM

## 2016-07-30 NOTE — H&P (Signed)
History and Physical    Jamie Buckley IDC:[id number] DOB: 1940/05/26 DOA: 07/30/2016  PCP: Karleen Hampshire., MD  Patient coming from: Patient was transferred from [facility] [place].  Chief Complaint: Right hip pain.  HPI: Jamie Buckley is a 76 y.o. female with history of hypertension, paroxysmal atrial fibrillation, depression and headaches had a fall 2 weeks ago following which patient has been having persistent right hip pain. Patient states she slipped and fell and did not lose her consciousness or hit her head. Patient's pain was persistent and patient came to the ER.   ED Course: X-rays reveal a right hip fracture. Dr. Berenice Primas on-call orthopedic surgeon has been consulted. On my exam patient denies any chest pain shortness of breath palpitations headache nausea vomiting or focal deficits.  Review of Systems: As per HPI, rest all negative.   Past Medical History:  Diagnosis Date  . Hypertension   . Reflux esophagitis     Past Surgical History:  Procedure Laterality Date  . FOOT SURGERY     pinning 2nd metatarsal  . THORACIC DISC SURGERY     s/p fall 12th thoracic fracture.      reports that she has never smoked. She has never used smokeless tobacco. She reports that she does not drink alcohol or use drugs.  Allergies  Allergen Reactions  . Morphine And Related   . Dilaudid [Hydromorphone Hcl] Anxiety    Hallucinations     Family History  Problem Relation Age of Onset  . Multiple myeloma Mother   . CAD Paternal Uncle     Prior to Admission medications   Medication Sig Start Date End Date Taking? Authorizing Provider  Calcium-Vitamins C & D (CALCIUM/C/D PO) Take by mouth.   Yes [provider]  cholecalciferol (VITAMIN D) 1000 units tablet Take 1,000 Units by mouth daily.   Yes [provider]  escitalopram (LEXAPRO) 10 MG tablet Take 10 mg by mouth daily.   Yes [provider]  meloxicam (MOBIC) 15 MG tablet Take 15 mg by mouth daily.    Yes [provider]  methocarbamol (ROBAXIN) 500 MG tablet Take 500 mg by mouth 4 (four) times daily.   Yes [provider]  traMADol (ULTRAM) 50 MG tablet Take by mouth every 6 (six) hours as needed.   Yes [provider]  UNKNOWN TO PATIENT Blood Pressure pill with diuretic   Yes [provider]    Physical Exam: Vitals:   07/30/16 1351 07/30/16 1622 07/30/16 1839  BP: 131/78 133/68 (!) 159/68  Pulse: 70 70 72  Resp: _0 Temp: 97.9 F (36.6 C)    TempSrc: Oral    SpO2: 100% 100% 99%  Weight: 81.6 kg (180 lb)    Height: _1  (1.702 m)        Constitutional: Moderately built and nourished. Vitals:   07/30/16 1351 07/30/16 1622 07/30/16 1839  BP: 131/78 133/68 (!) 159/68  Pulse: 70 70 72  Resp: _2 Temp: 97.9 F (36.6 C)    TempSrc: Oral    SpO2: 100% 100% 99%  Weight: 81.6 kg (180 lb)    Height: _3  (1.702 m)     Eyes: Anicteric no pallor. ENMT: No discharge from the ears eyes nose and mouth. Neck: No mass felt. No neck rigidity. Respiratory: No rhonchi or crepitations. Cardiovascular: S1 and S2 heard no murmurs appreciated. Abdomen: Soft nontender bowel sounds present. Musculoskeletal: Pain on moving right hip. Skin: No  rash. Neurologic: Alert awake oriented to time place and person. Moves all extremities. Psychiatric: Appears normal. Normal affect.   Labs on Admission: I have personally reviewed following labs and imaging studies  CBC:  Recent Labs Lab 07/30/16 1702  WBC 7.4  NEUTROABS 4.7  HGB 12.9  HCT 37.0  MCV 93.2  PLT 229   Basic Metabolic Panel:  Recent Labs Lab 07/30/16 1702  NA 135  K 3.1*  CL 100*  CO2 26  GLUCOSE 103*  BUN 33*  CREATININE 1.51*  CALCIUM 10.0   GFR: Estimated Creatinine Clearance: 36.5 mL/min (A) (by C-G formula based on SCr of 1.51 mg/dL (H)). Liver Function Tests: No results for input(s): AST, ALT, ALKPHOS, BILITOT, PROT, ALBUMIN in the last 168 hours. No  results for input(s): LIPASE, AMYLASE in the last 168 hours. No results for input(s): AMMONIA in the last 168 hours. Coagulation Profile:  Recent Labs Lab 07/30/16 1702  INR 1.00   Cardiac Enzymes: No results for input(s): CKTOTAL, CKMB, CKMBINDEX, TROPONINI in the last 168 hours. BNP (last 3 results) No results for input(s): PROBNP in the last 8760 hours. HbA1C: No results for input(s): HGBA1C in the last 72 hours. CBG: No results for input(s): GLUCAP in the last 168 hours. Lipid Profile: No results for input(s): CHOL, HDL, LDLCALC, TRIG, CHOLHDL, LDLDIRECT in the last 72 hours. Thyroid Function Tests: No results for input(s): TSH, T4TOTAL, FREET4, T3FREE, THYROIDAB in the last 72 hours. Anemia Panel: No results for input(s): VITAMINB12, FOLATE, FERRITIN, TIBC, IRON, RETICCTPCT in the last 72 hours. Urine analysis: No results found for: COLORURINE, APPEARANCEUR, LABSPEC, PHURINE, GLUCOSEU, HGBUR, BILIRUBINUR, KETONESUR, PROTEINUR, UROBILINOGEN, NITRITE, LEUKOCYTESUR Sepsis Labs: _0 (procalcitonin:4,lacticidven:4) )No results found for this or any previous visit (from the past 240 hour(s)).   Radiological Exams on Admission: Dg Hip Unilat With Pelvis 2-3 Views Right  Result Date: 07/30/2016 CLINICAL DATA:  [place] 2 weeks ago; inability to bear full weight. EXAM: DG HIP (WITH OR WITHOUT PELVIS) 2-3V RIGHT COMPARISON:  None. FINDINGS: There is a subcapital fracture of the right hip. There is no pelvic fracture. The left hip is grossly normal. There are degenerative changes of the lower lumbar spine. IMPRESSION: There is a subcapital fracture of the right hip. Electronically Signed   By: David  Martinique M.D.   On: 07/30/2016 16:34    EKG: Independently reviewed. Normal sinus rhythm.  Assessment/Plan Active Problems:   Closed right hip fracture, initial encounter (HCC)   PAF (paroxysmal atrial fibrillation) (HCC)   CKD (chronic kidney disease) stage 3, GFR 30-59 ml/min   Hip  fracture (HCC)    1. Right hip fracture status post mechanical fall - from medical standpoint a few patient is at moderate risk for intermediate risk procedure. Discussed with Dr. Berenice Primas orthopedic surgeon who will be taking patient for surgery. Patient will be kept nothing by mouth and on pain relief medications. 2. History of paroxysmal atrial fibrillation as per the chart - presently in sinus rhythm. Patient states she does not recall having A. fib. In care and agree with patient's primary care physician has mentioned paroxysmal atrial fibrillation. Not sure why patient is not on anticoagulation. 3. History of hypertension present on on antihypertensives - will keep patient on when necessary IV hydralazine. 4. Chronic kidney disease stage III - patient probably has chronic kidney disease. No old labs to compare. Follow metabolic panel. 5. History of headaches on Robaxin and tramadol.   DVT prophylaxis: SCDs. Code Status: Full code.  Family Communication: Patient's  daughter.  Disposition Plan: Rehabilitation.  Consults called: Orthopedics.  Admission status: Inpatient.    Rise Patience MD [facility] Pager [phone].  If 7PM-7AM, please contact night-coverage [email or web address] Password [facility]  07/30/2016, 8:13 PM

## 2016-07-30 NOTE — ED Notes (Signed)
Pt states injury is from a fall that happened 2 weeks prior.

## 2016-07-30 NOTE — ED Notes (Signed)
Patient transported to X-ray 

## 2016-07-31 ENCOUNTER — Encounter (HOSPITAL_COMMUNITY): Payer: Self-pay | Admitting: Orthopedic Surgery

## 2016-07-31 DIAGNOSIS — Z419 Encounter for procedure for purposes other than remedying health state, unspecified: Secondary | ICD-10-CM

## 2016-07-31 DIAGNOSIS — R52 Pain, unspecified: Secondary | ICD-10-CM

## 2016-07-31 DIAGNOSIS — I480 Paroxysmal atrial fibrillation: Secondary | ICD-10-CM

## 2016-07-31 DIAGNOSIS — N183 Chronic kidney disease, stage 3 (moderate): Secondary | ICD-10-CM

## 2016-07-31 DIAGNOSIS — I10 Essential (primary) hypertension: Secondary | ICD-10-CM

## 2016-07-31 DIAGNOSIS — S72001A Fracture of unspecified part of neck of right femur, initial encounter for closed fracture: Secondary | ICD-10-CM

## 2016-07-31 LAB — CBC
HEMATOCRIT: 34.5 % — AB (ref 36.0–46.0)
HEMOGLOBIN: 11.6 g/dL — AB (ref 12.0–15.0)
MCH: 31.4 pg (ref 26.0–34.0)
MCHC: 33.6 g/dL (ref 30.0–36.0)
MCV: 93.2 fL (ref 78.0–100.0)
Platelets: 248 10*3/uL (ref 150–400)
RBC: 3.7 MIL/uL — AB (ref 3.87–5.11)
RDW: 12.4 % (ref 11.5–15.5)
WBC: 12.7 10*3/uL — AB (ref 4.0–10.5)

## 2016-07-31 LAB — URINALYSIS, ROUTINE W REFLEX MICROSCOPIC
Bilirubin Urine: NEGATIVE
Glucose, UA: NEGATIVE mg/dL
Hgb urine dipstick: NEGATIVE
Ketones, ur: NEGATIVE mg/dL
LEUKOCYTES UA: NEGATIVE
Nitrite: NEGATIVE
PH: 5 (ref 5.0–8.0)
Protein, ur: NEGATIVE mg/dL
SPECIFIC GRAVITY, URINE: 1.011 (ref 1.005–1.030)

## 2016-07-31 LAB — BASIC METABOLIC PANEL
ANION GAP: 9 (ref 5–15)
BUN: 30 mg/dL — ABNORMAL HIGH (ref 6–20)
CHLORIDE: 102 mmol/L (ref 101–111)
CO2: 26 mmol/L (ref 22–32)
Calcium: 9.1 mg/dL (ref 8.9–10.3)
Creatinine, Ser: 1.32 mg/dL — ABNORMAL HIGH (ref 0.44–1.00)
GFR calc Af Amer: 45 mL/min — ABNORMAL LOW (ref >60)
GFR calc non Af Amer: 39 mL/min — ABNORMAL LOW (ref >60)
Glucose, Bld: 153 mg/dL — ABNORMAL HIGH (ref 65–99)
Potassium: 3.1 mmol/L — ABNORMAL LOW (ref 3.5–5.1)
SODIUM: 137 mmol/L (ref 135–145)

## 2016-07-31 LAB — MRSA PCR SCREENING: MRSA by PCR: NEGATIVE

## 2016-07-31 MED ORDER — ASPIRIN EC 325 MG PO TBEC
325.0000 mg | DELAYED_RELEASE_TABLET | Freq: Every day | ORAL | Status: DC
  Administered 2016-07-31 – 2016-08-01 (×2): 325 mg via ORAL
  Filled 2016-07-31 (×2): qty 1

## 2016-07-31 MED ORDER — POTASSIUM CHLORIDE CRYS ER 20 MEQ PO TBCR
40.0000 meq | EXTENDED_RELEASE_TABLET | Freq: Once | ORAL | Status: AC
  Administered 2016-07-31: 40 meq via ORAL
  Filled 2016-07-31: qty 2

## 2016-07-31 MED ORDER — POVIDONE-IODINE 10 % EX SWAB: 2.0000 "application " | Freq: Once | CUTANEOUS | Status: DC

## 2016-07-31 MED ORDER — TRAMADOL HCL 50 MG PO TABS
50.0000 mg | ORAL_TABLET | Freq: Four times a day (QID) | ORAL | Status: DC | PRN
  Administered 2016-07-31: 100 mg via ORAL
  Filled 2016-07-31: qty 2

## 2016-07-31 MED ORDER — DEXTROSE-NACL 5-0.45 % IV SOLN
INTRAVENOUS | Status: DC
  Administered 2016-07-31: 01:00:00 via INTRAVENOUS

## 2016-07-31 MED ORDER — PANTOPRAZOLE SODIUM 40 MG PO TBEC
40.0000 mg | DELAYED_RELEASE_TABLET | Freq: Every day | ORAL | Status: DC
  Administered 2016-07-31 – 2016-08-01 (×2): 40 mg via ORAL
  Filled 2016-07-31 (×2): qty 1

## 2016-07-31 MED ORDER — CLONAZEPAM 1 MG PO TABS
2.0000 mg | ORAL_TABLET | Freq: Every day | ORAL | Status: DC
  Administered 2016-07-31 – 2016-08-01 (×2): 2 mg via ORAL
  Filled 2016-07-31 (×2): qty 2

## 2016-07-31 MED ORDER — CEFAZOLIN SODIUM-DEXTROSE 2-4 GM/100ML-% IV SOLN: 2.0000 g | INTRAVENOUS | Status: DC

## 2016-07-31 MED ORDER — ESCITALOPRAM OXALATE 10 MG PO TABS
10.0000 mg | ORAL_TABLET | Freq: Every day | ORAL | Status: DC
  Administered 2016-07-31 – 2016-08-01 (×2): 10 mg via ORAL
  Filled 2016-07-31 (×2): qty 1

## 2016-07-31 NOTE — Progress Notes (Signed)
PROGRESS NOTE    Len Kluver  ZOX:[id number] DOB: August 16, 1940 DOA: 07/30/2016 PCP: [name], MD   Chief Complaint  Patient presents with  . Hip Pain    Brief Narrative:  HPI on 07/30/2016 by Dr. Midge Minium Zaynab Chipman is a 76 y.o. female with history of hypertension, paroxysmal atrial fibrillation, depression and headaches had a fall 2 weeks ago following which patient has been having persistent right hip pain. Patient states she slipped and fell and did not lose her consciousness or hit her head. Patient's pain was persistent and patient came to the ER.  Assessment & Plan   Right impacted femoral neck fracture -Occurred 2 weeks ago after sustaining a fall -X-ray showed impacted femoral neck fracture in valgus -Orthopedic surgery consultation appreciated -s/p cannulated hip pinning, right -Continue pain control -PT consulted and recommended home health, rolling walker -OT consultation pending  Paroxysmal atrial fibrillation -Currently in sinus rhythm -Per chart review, patient currently not on any anticoagulation -Continue aspirin -CHADSVASC 3 (age, gender, HTN)  Hypertension -Per chart review, however patient currently not on any medications at home -Continue hydralazine IV as needed  Chronic kidney disease, stage III -Upon admission, creatinine 1.5 . -Creatinine down to 1.32, GFR and stage III  -unknown baseline however per chart review, patient does have CKD stage III  Headaches -Continue tramadol, Robaxin  Depression/anxiety -Continue Lexapro, Klonopin  Hypokalemia -Replaced, continue to monitor CBC  Leukocytosis -Suspect reactive secondary to recent surgery -Patient denies any urinary or respiratory symptoms, currently afebrile  DVT Prophylaxis  SCDs  Code Status: Full  Family Communication: Daughter at bedside.  Disposition Plan: Admitted. Possible discharge to home within 24 hours.  Consultants Orthopedics  Procedures  Cannulated  Pinning right hip  Antibiotics   Anti-infectives    Start     Dose/Rate Route Frequency Ordered Stop   07/31/16 0600  ceFAZolin (ANCEF) IVPB 2g/100 mL premix  Status:  Discontinued     2 g 200 mL/hr over 30 Minutes Intravenous On call to O.R. 07/31/16 0056 07/31/16 0322   07/31/16 0400  ceFAZolin (ANCEF) IVPB 2g/100 mL premix     2 g 200 mL/hr over 30 Minutes Intravenous Every 6 hours 07/30/16 2323 07/31/16 1135   07/30/16 2112  ceFAZolin (ANCEF) 2-4 GM/100ML-% IVPB    Comments:  Jasper Loser   : cabinet override      07/30/16 2112 07/31/16 0929      Subjective:   Alvira Philips seen and examined today.  Patient feels hip pain is much better today. Denies any current chest pain, shortness breath, abdominal pain, nausea or vomiting, diarrhea constipation.    Objective:   Vitals:   07/31/16 0059 07/31/16 0215 07/31/16 0311 07/31/16 0436  BP: (!) 115/50 (!) 110/54 (!) 109/59 105/63  Pulse: 87 91 84 89  Resp: 12 12 12 14   Temp: 97.8 F (36.6 C) 97.9 F (36.6 C) 98 F (36.7 C) 97.9 F (36.6 C)  TempSrc: Oral Oral Oral Oral  SpO2: 97% 100% 100% 98%  Weight:      Height:        Intake/Output Summary (Last 24 hours) at 07/31/16 1410 Last data filed at 07/31/16 0600  Gross per 24 hour  Intake             2465 ml  Output              325 ml  Net             2140 ml  Filed Weights   07/30/16 1351  Weight: 81.6 kg (180 lb)    Exam  General: Well developed, well nourished, NAD, appears stated age  HEENT: NCAT, mucous membranes moist.   Cardiovascular: S1 S2 auscultated, no rubs, murmurs or gallops. Regular rate and rhythm.  Respiratory: Clear to auscultation bilaterally with equal chest rise  Abdomen: Soft, nontender, nondistended, + bowel sounds  Extremities: warm dry without cyanosis clubbing or edema, bandage on right hip  Neuro: AAOx3, nonfocal  Psych: Normal affect and demeanor with intact judgement and insight   Data Reviewed: I have personally  reviewed following labs and imaging studies  CBC:  Recent Labs Lab 07/30/16 1702 07/31/16 0117  WBC 7.4 12.7*  NEUTROABS 4.7  --   HGB 12.9 11.6*  HCT 37.0 34.5*  MCV 93.2 93.2  PLT 288 248   Basic Metabolic Panel:  Recent Labs Lab 07/30/16 1702 07/31/16 0117  NA 135 137  K 3.1* 3.1*  CL 100* 102  CO2 26 26  GLUCOSE 103* 153*  BUN 33* 30*  CREATININE 1.51* 1.32*  CALCIUM 10.0 9.1   GFR: Estimated Creatinine Clearance: 41.7 mL/min (A) (by C-G formula based on SCr of 1.32 mg/dL (H)). Liver Function Tests: No results for input(s): AST, ALT, ALKPHOS, BILITOT, PROT, ALBUMIN in the last 168 hours. No results for input(s): LIPASE, AMYLASE in the last 168 hours. No results for input(s): AMMONIA in the last 168 hours. Coagulation Profile:  Recent Labs Lab 07/30/16 1702  INR 1.00   Cardiac Enzymes: No results for input(s): CKTOTAL, CKMB, CKMBINDEX, TROPONINI in the last 168 hours. BNP (last 3 results) No results for input(s): PROBNP in the last 8760 hours. HbA1C: No results for input(s): HGBA1C in the last 72 hours. CBG: No results for input(s): GLUCAP in the last 168 hours. Lipid Profile: No results for input(s): CHOL, HDL, LDLCALC, TRIG, CHOLHDL, LDLDIRECT in the last 72 hours. Thyroid Function Tests: No results for input(s): TSH, T4TOTAL, FREET4, T3FREE, THYROIDAB in the last 72 hours. Anemia Panel: No results for input(s): VITAMINB12, FOLATE, FERRITIN, TIBC, IRON, RETICCTPCT in the last 72 hours. Urine analysis:    Component Value Date/Time   COLORURINE STRAW (A) 07/31/2016 0355   APPEARANCEUR CLEAR 07/31/2016 0355   LABSPEC 1.011 07/31/2016 0355   PHURINE 5.0 07/31/2016 0355   GLUCOSEU NEGATIVE 07/31/2016 0355   HGBUR NEGATIVE 07/31/2016 0355   BILIRUBINUR NEGATIVE 07/31/2016 0355   KETONESUR NEGATIVE 07/31/2016 0355   PROTEINUR NEGATIVE 07/31/2016 0355   NITRITE NEGATIVE 07/31/2016 0355   LEUKOCYTESUR NEGATIVE 07/31/2016 0355   Sepsis  Labs: @LABRCNTIP (procalcitonin:4,lacticidven:4)  ) Recent Results (from the past 240 hour(s))  MRSA PCR Screening     Status: None   Collection Time: 07/30/16 12:53 PM  Result Value Ref Range Status   MRSA by PCR NEGATIVE NEGATIVE Final    Comment:        The GeneXpert MRSA Assay (FDA approved for NASAL specimens only), is one component of a comprehensive MRSA colonization surveillance program. It is not intended to diagnose MRSA infection nor to guide or monitor treatment for MRSA infections.       Radiology Studies: Dg C-arm 1-60 Min-no Report  Result Date: 07/30/2016 Fluoroscopy was utilized by the requesting physician.  No radiographic interpretation.   Dg Hip Operative Unilat W Or W/o Pelvis Right  Result Date: 07/30/2016 CLINICAL DATA:  Hip surgery EXAM: OPERATIVE right HIP (WITH PELVIS IF PERFORMED) 2 VIEWS TECHNIQUE: Fluoroscopic spot image(s) were submitted for interpretation post-operatively. COMPARISON:  07/30/2016  FINDINGS: Two low resolution intraoperative spot views of the right hip are submitted. Total fluoroscopy time was 1:00 26.8 seconds. The images demonstrate 3 screw fixation of the proximal right femur across a proximal femoral fracture. IMPRESSION: Intraoperative fluoroscopic assistance during right hip surgery Electronically Signed   By: Jasmine PangKim  Fujinaga M.D.   On: 07/30/2016 23:08   Dg Hip Unilat With Pelvis 2-3 Views Right  Result Date: 07/30/2016 CLINICAL DATA:  Larey SeatFell 2 weeks ago; inability to bear full weight. EXAM: DG HIP (WITH OR WITHOUT PELVIS) 2-3V RIGHT COMPARISON:  None. FINDINGS: There is a subcapital fracture of the right hip. There is no pelvic fracture. The left hip is grossly normal. There are degenerative changes of the lower lumbar spine. IMPRESSION: There is a subcapital fracture of the right hip. Electronically Signed   By: David  SwazilandJordan M.D.   On: 07/30/2016 16:34     Scheduled Meds: . aspirin EC  325 mg Oral Q breakfast  . clonazePAM  2  mg Oral Daily  . docusate sodium  100 mg Oral BID  . escitalopram  10 mg Oral Daily  . pantoprazole  40 mg Oral Daily  . povidone-iodine  2 application Topical Once   Continuous Infusions: . dextrose 5 % and 0.45% NaCl 50 mL/hr at 07/31/16 0106  . methocarbamol (ROBAXIN)  IV 500 mg (07/30/16 2308)     LOS: 1 day   Time Spent in minutes   30 minutes  Sonoma Firkus D.O. on 07/31/2016 at 2:10 PM  Between 7am to 7pm - Pager - [phone]  After 7pm go to [email or web address] - password [email or web address]  And look for the night coverage person covering for me after hours  [facility] Office  [phone]

## 2016-07-31 NOTE — Evaluation (Signed)
Physical Therapy Evaluation Patient Details Name: Jamie Buckley MRN: [id number] DOB: 07/11/1940 Today's Date: 07/31/2016   History of Present Illness  Jamie Buckley is a 76 y.o. female with history of hypertension, paroxysmal atrial fibrillation, depression and headaches who had a fall 2 weekds ago, gradual onset worsening pain and inability to walk. found to have impacted femoral head fracture on the right. S/p ORIF  07/30/16.  Clinical Impression  The patient able to ambulate with RW today. Patient prefers to go home and stay with sister who can provide care. Pt admitted with above diagnosis. Pt currently with functional limitations due to the deficits listed below (see PT Problem List). Pt will benefit from skilled PT to increase their independence and safety with mobility to allow discharge to the venue listed below.       Follow Up Recommendations  HHPT    Equipment Recommendations    RW- needs a new one   Recommendations for Other Services       Precautions / Restrictions Precautions Precautions: Fall Restrictions Weight Bearing Restrictions: Yes RLE Weight Bearing: Touchdown weight bearing      Mobility  Bed Mobility Overal bed mobility: Needs Assistance Bed Mobility: Supine to Sit     Supine to sit: Supervision     General bed mobility comments: patient able to move self to bed edge without assistance  Transfers Overall transfer level: Needs assistance Equipment used: Rolling walker (2 wheeled) Transfers: Sit to/from Stand Sit to Stand: Min guard         General transfer comment[facility] with min assist.  Ambulation/Gait Ambulation/Gait assistance: Min assist Ambulation Distance (Feet): 20 Feet Assistive device: Rolling walker (2 wheeled) Gait Pattern/deviations: Step-to pattern     General Gait Details: cues for TDWB which she was able to perform.   Stairs            Wheelchair Mobility    Modified Rankin (Stroke Patients Only)        Balance                                             Pertinent Vitals/Pain Pain Assessment: 0-10 Pain Score: 3  Pain Location: right hip Pain Descriptors / Indicators: Discomfort;Sore Pain Intervention(s): Monitored during session;Ice applied;Patient requesting pain meds-RN notified    Home Living Family/patient expects to be discharged to:: Private residence Living Arrangements: Alone Available Help at Discharge: Family;Available 24 hours/day Type of Home: House Home Access: Ramped entrance (sister's)     Home Layout: One level Home Equipment: Bedside commode Additional Comments: needs  new RW. Plans to stay with sister who has a  ramp and possibly a WC    Prior Function Level of Independence: Independent               Hand Dominance        Extremity/Trunk Assessment   Upper Extremity Assessment Upper Extremity Assessment: Defer to OT evaluation    Lower Extremity Assessment Lower Extremity Assessment: RLE deficits/detail RLE Deficits / Details: able to move right leg    Cervical / Trunk Assessment Cervical / Trunk Assessment: Normal  Communication   Communication: No difficulties  Cognition Arousal/Alertness: Awake/alert Behavior During Therapy: WFL for tasks assessed/performed Overall Cognitive Status: Within Functional Limits for tasks assessed  General Comments      Exercises     Assessment/Plan    PT Assessment Patient needs continued PT services  PT Problem List Decreased strength;Decreased range of motion;Decreased activity tolerance;Decreased balance;Decreased mobility;Decreased knowledge of precautions;Decreased safety awareness;Decreased knowledge of use of DME;Pain       PT Treatment Interventions DME instruction;Gait training;Functional mobility training;Therapeutic activities;Therapeutic exercise;Patient/family education    PT Goals (Current goals can  be found in the Care Plan section)  Acute Rehab PT Goals Patient Stated Goal: to go stay with my sisiter. PT Goal Formulation: With patient Time For Goal Achievement: 08/07/16 Potential to Achieve Goals: Good    Frequency Min 6X/week   Barriers to discharge        Co-evaluation               AM-PAC PT "6 Clicks" Daily Activity  Outcome Measure Difficulty turning over in bed (including adjusting bedclothes, sheets and blankets)?: None Difficulty moving from lying on back to sitting on the side of the bed? : None Difficulty sitting down on and standing up from a chair with arms (e.g., wheelchair, bedside commode, etc,.)?: A Little Help needed moving to and from a bed to chair (including a wheelchair)?: A Little Help needed walking in hospital room?: A Little Help needed climbing 3-5 steps with a railing? : A Lot 6 Click Score: 19    End of Session Equipment Utilized During Treatment: Gait belt Activity Tolerance: Patient tolerated treatment well Patient left: in chair;with call bell/phone within reach;with chair alarm set Nurse Communication: Mobility status;Patient requests pain meds PT Visit Diagnosis: Difficulty in walking, not elsewhere classified (R26.2);Pain Pain - Right/Left: Right Pain - part of body: Hip    Time: 1210-1234 PT Time Calculation (min) (ACUTE ONLY): 24 min   Charges:     PT Treatments $Gait Training: 8-22 mins   PT G CodesBlanchard Kelch PT [phone]    Rada Hay 07/31/2016, 1:24 PM

## 2016-07-31 NOTE — Progress Notes (Signed)
Discharge plan:  [place] PT recommendations gone over with patient. [facility] contacted for referral. Will need MD order for HHPT. Pt also requesting RW, 3in1 and wheelchair. Will need MD orders for DME. Sandford Crazeora Euan Wandler RN,BSN,NCM [phone]

## 2016-07-31 NOTE — Progress Notes (Signed)
Initial Nutrition Assessment  DOCUMENTATION CODES:   Not applicable  INTERVENTION:    Continue Regular diet  NUTRITION DIAGNOSIS:   Increased nutrient needs related to  (hip fracture) as evidenced by estimated needs  GOAL:   Patient will meet greater than or equal to 90% of their needs  MONITOR:   PO intake, Supplement acceptance, Labs, Weight trends, Skin, I & O's  REASON FOR ASSESSMENT:   Consult Hip fracture protocol  ASSESSMENT:    76 y.o. Female with history of hypertension, paroxysmal atrial fibrillation, depression and headaches had a fall 2 weeks ago following which patient has been having persistent right hip pain. Patient states she slipped and fell and did not lose her consciousness or hit her head. Patient's pain was persistent and patient came to the ER.   Chart reviewed.  X-rays reveal a R hip fracture.  Plan is for surgery per Orthopedics. Pt reports a good appetite.  Had pancakes for breakfast.  Ordered a [place]key sandwich for lunch. States she has no recent unintentional weight loss. Declined addition or nutrition supplements. Medications and labs reviewed.  Nutrition focused physical exam completed.  No muscle or subcutaneous fat depletion noticed.  Diet Order:  Diet regular Room service appropriate? Yes; Fluid consistency: Thin  Skin:  Reviewed, no issues  Last BM:  5/15  Height:   Ht Readings from Last 1 Encounters:  07/30/16 5\' 7"  (1.702 m)   Weight:   Wt Readings from Last 1 Encounters:  07/30/16 180 lb (81.6 kg)   Ideal Body Weight:  61.3 kg  BMI:  Body mass index is 28.19 kg/m.  Estimated Nutritional Needs:   Kcal:  1800-2000  Protein:  90-100 gm  Fluid:  1.8-2.0 L  EDUCATION NEEDS:   No education needs identified at this time  Maureen ChattersKatie Maylynn Orzechowski, RD, LDN Pager #: [phone] After-Hours Pager #: [phone]

## 2016-07-31 NOTE — Op Note (Signed)
NAME:  Jamie Buckley, Jamie Buckley                    ACCOUNT NO.:  MEDICAL RECORD NO.:  [id number]  LOCATION:                                 FACILITY:  PHYSICIAN:  Harvie JuniorJohn L. Owens Hara, M.D.        DATE OF BIRTH:  DATE OF PROCEDURE:  07/30/2016 DATE OF DISCHARGE:                              OPERATIVE REPORT   PREOPERATIVE DIAGNOSIS:  Impacted femoral neck fracture, right.  POSTOPERATIVE DIAGNOSIS:  Impacted femoral neck fracture, right.  PROCEDURES PERFORMED: 1. Cannulated screw fixation of impacted femoral neck fracture, right     side. 2. Interpretation of multiple intraoperative fluoroscopic images.  SURGEON:  Harvie JuniorJohn L. Ramy Greth, M.D.  ASSISTANT:  Marshia LyJames Bethune, P.A.  ANESTHESIA:  General.  BRIEF HISTORY:  Ms. [name] is a 76 year old female with a history of falling 2 weeks ago.  She was staying at home, but her pain became worse and worse in her hip.  She was unable ambulate or walk and because of that she presented to the emergency room.  X-ray showed she had impacted femoral neck fracture.  We talked about treatment options, but felt that cannulated screw fixation was appropriate given the alignment of her fracture and she was brought to the operating room for this procedure.  DESCRIPTION OF PROCEDURE:  The patient was brought to the operating room.  After adequate anesthesia was obtained with general anesthetic, the patient was placed supine on the Hana bed with the well leg holder. Intraoperative fluoroscopic images were then taken prior to prepping to ensure that she did have adequate reduction and alignment of her femoral neck.  She did have a slight posterior slam, but I felt that we could certainly easily get screws in and certainly she had been on this for a couple of weeks.  I felt that her impacted position must be stable.  At this point, the patient was prepped and draped in the usual sterile fashion.  Fluoroscopic images were used to assess the position for skin incision  and once this was done, a skin incision was made and 3 guidewires were placed across the fracture site in the central portion of the head, 1 inferior, 1 superior, and 1 posterior.  These then were measured and screws were placed across the fracture site giving excellent fixation within the femoral head.  Once this was done, multiple fluoroscopic images were taken, as well as live fluoro with the hip moving to make sure that there was adequacy of reduction and no penetration of screws in the head.  At this point, her wounds were irrigated, suctioned dry, closed in layers.  Sterile compressive dressing was applied.  The patient was taken to the recovery room and was noted to be in satisfactory condition.  Estimated blood loss for procedure was minimal.     Harvie JuniorJohn L. Reymundo Winship, M.D.     Ranae PlumberJLG/MEDQ  D:  07/31/2016  T:  07/31/2016  Job:  [id number]  cc:   Harvie JuniorJohn L. Germani Gavilanes, M.D. Fax: [phone]

## 2016-07-31 NOTE — Brief Op Note (Signed)
07/30/2016  1:46 PM  PATIENT:  Jamie Buckley  76 y.o. female  PRE-OPERATIVE DIAGNOSIS:  right hip cnnulated pinning  POST-OPERATIVE DIAGNOSIS:  fracture right hip  PROCEDURE:  Procedure(s): CANNULATED HIP PINNING (Right)  SURGEON:  Surgeon(s) and Role:    Jodi Geralds* Rever Pichette, MD - Primary  PHYSICIAN ASSISTANT:   ASSISTANTS: bethune   ANESTHESIA:   general  EBL:  No intake/output data recorded.  BLOOD ADMINISTERED:none  DRAINS: none   LOCAL MEDICATIONS USED:  MARCAINE     SPECIMEN:  No Specimen  DISPOSITION OF SPECIMEN:  N/A  COUNTS:  YES  TOURNIQUET:  * No tourniquets in log *  DICTATION: .Other Dictation: Dictation Number [id number]  PLAN OF CARE: Admit to inpatient   PATIENT DISPOSITION:  PACU - hemodynamically stable.   Delay start of Pharmacological VTE agent (>24hrs) due to surgical blood loss or risk of bleeding: no

## 2016-07-31 NOTE — Progress Notes (Signed)
Physical Therapy Treatment Patient Details Name: Jamie PhilipsMary Scully MRN: [id number] DOB: 10/17/1940 Today's Date: 07/31/2016    History of Present Illness Jamie Buckley is a 76 y.o. female with history of hypertension, paroxysmal atrial fibrillation, depression and headaches who had a fall 2 weekds ago, gradual onset worsening pain and inability to walk. found to have impacted femoral head fracture on the right. S/p ORIF  07/30/16.    PT Comments    The patient is progressing well. Plans Dc home with sister and HHPT.   Follow Up Recommendations  Home health PT     Equipment Recommendations  Rolling walker with 5" wheels    Recommendations for Other Services       Precautions / Restrictions Precautions Precautions: Fall Restrictions Weight Bearing Restrictions: Yes RLE Weight Bearing: Touchdown weight bearing    Mobility  Bed Mobility Overal bed mobility: Needs Assistance Bed Mobility: Sit to Supine     Supine to sit: Supervision Sit to supine: Min assist   General bed mobility comments: assist with the right leg onto the bed  Transfers Overall transfer level: Needs assistance Equipment used: Rolling walker (2 wheeled) Transfers: Sit to/from Stand Sit to Stand: Min guard;Min assist         General transfer comment: cues for hand placement  Ambulation/Gait Ambulation/Gait assistance: Min assist Ambulation Distance (Feet): 20 Feet (x 2) Assistive device: Rolling walker (2 wheeled) Gait Pattern/deviations: Step-to pattern     General Gait Details: cues for TDWB which she was able to perform.    Stairs            Wheelchair Mobility    Modified Rankin (Stroke Patients Only)       Balance Overall balance assessment: Needs assistance;History of Falls Sitting-balance support: No upper extremity supported;Feet supported Sitting balance-Leahy Scale: Good     Standing balance support: During functional activity;Bilateral upper extremity  supported Standing balance-Leahy Scale: Fair                              Cognition Arousal/Alertness: Awake/alert Behavior During Therapy: WFL for tasks assessed/performed Overall Cognitive Status: Within Functional Limits for tasks assessed                                        Exercises      General Comments        Pertinent Vitals/Pain Pain Assessment: 0-10 Pain Score: 3  Pain Location: right hip Pain Descriptors / Indicators: Discomfort;Sore Pain Intervention(s): Monitored during session;Premedicated before session    Home Living Family/patient expects to be discharged to:: Private residence Living Arrangements: Alone Available Help at Discharge: Family;Available 24 hours/day Type of Home: House Home Access: Ramped entrance (sister's)   Home Layout: One level Home Equipment: Bedside commode Additional Comments: needs  new RW. Plans to stay with sister who has a  ramp and possibly a WC    Prior Function Level of Independence: Independent          PT Goals (current goals can now be found in the care plan section) Acute Rehab PT Goals Patient Stated Goal: to go stay with my sisiter. PT Goal Formulation: With patient Time For Goal Achievement: 08/07/16 Potential to Achieve Goals: Good Progress towards PT goals: Progressing toward goals    Frequency    Min 6X/week      PT Plan Current  plan remains appropriate    Co-evaluation              AM-PAC PT "6 Clicks" Daily Activity  Outcome Measure  Difficulty turning over in bed (including adjusting bedclothes, sheets and blankets)?: A Little Difficulty moving from lying on back to sitting on the side of the bed? : A Little Difficulty sitting down on and standing up from a chair with arms (e.g., wheelchair, bedside commode, etc,.)?: None Help needed moving to and from a bed to chair (including a wheelchair)?: None Help needed walking in hospital room?: None Help needed  climbing 3-5 steps with a railing? : None 6 Click Score: 22    End of Session Equipment Utilized During Treatment: Gait belt Activity Tolerance: Patient tolerated treatment well Patient left: in chair;with call bell/phone within reach;with chair alarm set Nurse Communication: Mobility status;Patient requests pain meds PT Visit Diagnosis: Difficulty in walking, not elsewhere classified (R26.2);Pain Pain - Right/Left: Right Pain - part of body: Hip     Time: 1610-9604 PT Time Calculation (min) (ACUTE ONLY): 24 min  Charges:  $Gait Training: 23-37 mins                    G CodesBlanchard Kelch PT [phone]    Rada Hay 07/31/2016, 3:40 PM

## 2016-08-01 DIAGNOSIS — D72829 Elevated white blood cell count, unspecified: Secondary | ICD-10-CM

## 2016-08-01 DIAGNOSIS — E876 Hypokalemia: Secondary | ICD-10-CM

## 2016-08-01 LAB — CBC
HEMATOCRIT: 30.2 % — AB (ref 36.0–46.0)
HEMOGLOBIN: 9.9 g/dL — AB (ref 12.0–15.0)
MCH: 30.8 pg (ref 26.0–34.0)
MCHC: 32.8 g/dL (ref 30.0–36.0)
MCV: 94.1 fL (ref 78.0–100.0)
Platelets: 246 10*3/uL (ref 150–400)
RBC: 3.21 MIL/uL — AB (ref 3.87–5.11)
RDW: 12.4 % (ref 11.5–15.5)
WBC: 7.8 10*3/uL (ref 4.0–10.5)

## 2016-08-01 LAB — BASIC METABOLIC PANEL
ANION GAP: 8 (ref 5–15)
BUN: 28 mg/dL — AB (ref 6–20)
CHLORIDE: 101 mmol/L (ref 101–111)
CO2: 27 mmol/L (ref 22–32)
Calcium: 8.7 mg/dL — ABNORMAL LOW (ref 8.9–10.3)
Creatinine, Ser: 1.14 mg/dL — ABNORMAL HIGH (ref 0.44–1.00)
GFR calc Af Amer: 54 mL/min — ABNORMAL LOW (ref >60)
GFR, EST NON AFRICAN AMERICAN: 47 mL/min — AB (ref >60)
GLUCOSE: 115 mg/dL — AB (ref 65–99)
POTASSIUM: 3.8 mmol/L (ref 3.5–5.1)
Sodium: 136 mmol/L (ref 135–145)

## 2016-08-01 NOTE — Care Management Note (Signed)
Case Management Note  Patient Details  Name: Jamie Buckley MRN: [id number] Date of Birth: 06/05/1940  Subjective/Objective:     Closed right hip fracture               Action/Plan: Discharge Planning: NCM spoke to pt and requesting wheelchair for home. States she lives alone. Has a RW from family that she can borrow. Contacted [facility] DME rep for wheelchair and 3n1 bedside commode for home. [facility] make [facility] of [facility] home today.   PCP Laqueta DueFURR, SARA M.  MD   Expected Discharge Date:  08/01/16               Expected Discharge Plan:  Home w Home Health Services  In-House Referral:  NA  Discharge planning Services  CM Consult  Post Acute Care Choice:  Home Health Choice offered to:  Patient  DME Arranged:  3-N-1, Lightweight manual wheelchair with seat cushion DME Agency:  [facility]  [facility]  [facility]  Status of Service:  Completed, signed off  If discussed at Long Length of Stay Meetings, dates discussed:    Additional Comments:  Elliot CousinShavis, Finnean Cerami Ellen, RN 08/01/2016, 2:22 PM

## 2016-08-01 NOTE — Progress Notes (Addendum)
Physical Therapy Treatment Patient Details Name: Jamie Buckley MRN: [id number] DOB: 07/28/1940 Today's Date: 08/01/2016    History of Present Illness Jamie Buckley is a 76 y.o. female with history of hypertension, paroxysmal atrial fibrillation, depression and headaches who had a fall 2 weekds ago, gradual onset worsening pain and inability to walk. found to have impacted femoral head fracture on the right. S/p ORIF  07/30/16.    PT Comments    The patient  Demonstrated decreased balance during ambulation requiring assistance to rebalance. Cues for sequence with RW. The patient declined considering rehab when asked again. States her sister can assist.  .   Home health PT;Supervision/Assistance - 24 hour     Equipment Recommendations  Rolling walker with 5" wheels;3in1 (PT)    Recommendations for Other Services       Precautions / Restrictions Precautions Precautions: Fall Restrictions RLE Weight Bearing: Touchdown weight bearing    Mobility  Bed Mobility Overal bed mobility: Needs Assistance Bed Mobility: Supine to Sit     Supine to sit: Supervision     General bed mobility comments: able to slide right leg to bed edge and sit up w/out assistance  Transfers Overall transfer level: Needs assistance Equipment used: Rolling walker (2 wheeled) Transfers: Sit to/from Stand Sit to Stand: Min assist         General transfer comment: cues for hand placement  Ambulation/Gait Ambulation/Gait assistance: Min assist Ambulation Distance (Feet): 50 Feet Assistive device: Rolling walker (2 wheeled) Gait Pattern/deviations: Step-to pattern;Decreased stance time - right     General Gait Details: cues for TDWB which she was able to perform. patient had  balance loss x 1 requiring assistance to regain after she had taken hand off of  RW to "talk" with her hand. Cautioned to not let go of the RW. She was unsteady along the way, did better when moved slowly with sequencing  of RW,  rt leg step, then lt leg. multiple  cues for sequence and position inside the RW.   Stairs HAS A ramp at sister's home            Wheelchair Mobility    Modified Rankin (Stroke Patients Only)       Balance Overall balance assessment: History of Falls Sitting-balance support: Feet supported;No upper extremity supported Sitting balance-Leahy Scale: Good     Standing balance support: During functional activity;Bilateral upper extremity supported Standing balance-Leahy Scale: Poor Standing balance comment: lost balance after letting go of RW on right hand.                            Cognition Arousal/Alertness: Awake/alert                                            Exercises      General Comments        Pertinent Vitals/Pain Pain Score: 3  Pain Location: right hip Pain Descriptors / Indicators: Discomfort;Sore;Tightness Pain Intervention(s): Premedicated before session;Monitored during session    Home Living                      Prior Function            PT Goals (current goals can now be found in the care plan section) Progress towards PT goals: Progressing toward goals  Frequency    Min 6X/week      PT Plan Current plan remains appropriate    Co-evaluation              AM-PAC PT "6 Clicks" Daily Activity  Outcome Measure  Difficulty turning over in bed (including adjusting bedclothes, sheets and blankets)?: A Little Difficulty moving from lying on back to sitting on the side of the bed? : A Little Difficulty sitting down on and standing up from a chair with arms (e.g., wheelchair, bedside commode, etc,.)?: A Little Help needed moving to and from a bed to chair (including a wheelchair)?: A Little Help needed walking in hospital room?: A Little Help needed climbing 3-5 steps with a railing? : A Little 6 Click Score: 18    End of Session   Activity Tolerance: Patient tolerated treatment  well Patient left: in chair;with call bell/phone within reach;with chair alarm set Nurse Communication: Mobility status PT Visit Diagnosis: Difficulty in walking, not elsewhere classified (R26.2);Pain;Unsteadiness on feet (R26.81) Pain - Right/Left: Right Pain - part of body: Hip     Time: 1324-4010 PT Time Calculation (min) (ACUTE ONLY): 14 min  Charges:  $Gait Training: 8-22 mins                    G CodesBlanchard Kelch PT [phone]    Rada Hay 08/01/2016, 9:52 AM

## 2016-08-01 NOTE — Progress Notes (Signed)
PATIENT ID: Jamie PhilipsMary Buckley  MRN: [id number]  DOB/AGE:  05/27/1940 / 76 y.o.  2 Days Post-Op Procedure(s) (LRB): CANNULATED HIP PINNING (Right)    PROGRESS NOTE Subjective:   Patient is alert, oriented, no Nausea, no Vomiting, yes passing gas, no Bowel Movement. Taking PO well. Denies SOB, Chest or Calf Pain. Using Incentive Spirometer, PAS in place. Ambulate  Touch down weight bearing with pt walking 20 ft with therapy, Patient reports pain as mild and moderate.    Objective: Vital signs in last 24 hours: Temp:  [97.6 F (36.4 C)-98.3 F (36.8 C)] 97.6 F (36.4 C) (05/19 0530) Pulse Rate:  [67-90] 67 (05/19 0530) Resp:  [14-16] 16 (05/19 0530) BP: (115-141)/(59-63) 115/63 (05/19 0530) SpO2:  [97 %-100 %] 100 % (05/19 0530)    Intake/Output from previous day: I/O last 3 completed shifts: In: 2545 [P.O.:1200; I.V.:1245; IV Piggyback:100] Out: 1625 [Urine:1600; Blood:25]   Intake/Output this shift: No intake/output data recorded.   LABORATORY DATA:  Recent Labs  07/30/16 1702 07/31/16 0117 08/01/16 0427  WBC 7.4 12.7* 7.8  HGB 12.9 11.6* 9.9*  HCT 37.0 34.5* 30.2*  PLT 288 248 246  NA 135 137 136  K 3.1* 3.1* 3.8  CL 100* 102 101  CO2 26 26 27   BUN 33* 30* 28*  CREATININE 1.51* 1.32* 1.14*  GLUCOSE 103* 153* 115*  INR 1.00  --   --   CALCIUM 10.0 9.1 8.7*    Examination: Neurologically intact Neurovascular intact Sensation intact distally Intact pulses distally Dorsiflexion/Plantar flexion intact Incision: dressing C/D/I and no drainage No cellulitis present Compartment soft}  Assessment:   2 Days Post-Op Procedure(s) (LRB): CANNULATED HIP PINNING (Right) ADDITIONAL DIAGNOSIS:  Renal Insufficiency Chronic  Plan:  Touch Down Weight Bearing (TDWB)  DVT Prophylaxis:  Aspirin  DISCHARGE PLAN: Home, today if cleared by medicine and passes therapy goals  DISCHARGE NEEDS: HHPT, Walker and 3-in-1 comode seat     PHILLIPS, ERIC R 08/01/2016, 7:49 AM

## 2016-08-01 NOTE — Evaluation (Signed)
Occupational Therapy Evaluation Patient Details Name: Jamie Buckley MRN: [id number] DOB: [birth year] Today's Date: 08/01/2016    History of Present Illness Jamie Buckley is a 76 y.o. female with history of hypertension, paroxysmal atrial fibrillation, depression and headaches who had a fall 2 weekds ago, gradual onset worsening pain and inability to walk. found to have impacted femoral head fracture on the right. S/p ORIF  07/30/16.   Clinical Impression   Pt was independent at her baseline. Presents with generalized weakness, pain and impaired standing balance. Pt requiring min guard assistance for standing activities and mobility. Educated in compensatory strategies for ADL, DME, home safety and use of AE for LB bathing and dressing. Pt verbalizing understanding of all information. Plans to go home with the assistance of her sister later today. Will defer further OT to HHOT.    Follow Up Recommendations  Home health OT;Supervision/Assistance - 24 hour    Equipment Recommendations  None recommended by OT    Recommendations for Other Services       Precautions / Restrictions Precautions Precautions: Fall Restrictions Weight Bearing Restrictions: Yes RLE Weight Bearing: Touchdown weight bearing Other Position/Activity Restrictions: pt able to state and generalize      Mobility Bed Mobility      General bed mobility comments: pt in chair  Transfers Overall transfer level: Needs assistance Equipment used: Rolling walker (2 wheeled) Transfers: Sit to/from Stand Sit to Stand: Min guard         General transfer comment: no physical assist from chair or 3 in 1, min guard for safety    Balance Overall balance assessment: History of Falls Sitting-balance support: Feet supported;No upper extremity supported Sitting balance-Leahy Scale: Good     Standing balance support: During functional activity;Bilateral upper extremity supported Standing balance-Leahy Scale:  Poor Standing balance comment: heavily reliant on UEs and walker                           ADL either performed or assessed with clinical judgement   ADL Overall ADL's : Needs assistance/impaired Eating/Feeding: Independent;Sitting   Grooming: Wash/dry hands;Standing;Min guard   Upper Body Bathing: Set up;Sitting   Lower Body Bathing: Minimal assistance;Sit to/from stand Lower Body Bathing Details (indicate cue type and reason): educated in use of long handled bath sponge and reacher Upper Body Dressing : Set up;Sitting   Lower Body Dressing: Minimal assistance;Sit to/from stand Lower Body Dressing Details (indicate cue type and reason): educated in use of sock aid, reacher, long shoe horn and safe footwear Toilet Transfer: Min guard;Ambulation;RW;BSC Toilet Transfer Details (indicate cue type and reason): instructed in use of 3 in 1 both over toilet and beside bed at night Toileting- Clothing Manipulation and Hygiene: Min guard;Sit to/from Nurse, children's Details (indicate cue type and reason): pt plans to sponge bathe until she returns to her home, instructed in availability of tub transfer bench for sister's tub vs technique for washing hair over sink and sponge bathing. Functional mobility during ADLs: Min guard;Rolling walker General ADL Comments: Instructed in AP transfer to shower, transporting items safely with RW, using w/c as seating if pt does not have a seat from which she can rise at home     Vision Patient Visual Report: No change from baseline       Perception     Praxis      Pertinent Vitals/Pain Pain Assessment: Faces Pain Score: 3  Faces Pain Scale: Hurts little  more Pain Location: right hip Pain Descriptors / Indicators: Discomfort;Sore Pain Intervention(s): Monitored during session;Premedicated before session;Repositioned     Hand Dominance Right   Extremity/Trunk Assessment Upper Extremity Assessment Upper Extremity  Assessment: Overall WFL for tasks assessed   Lower Extremity Assessment Lower Extremity Assessment: Defer to PT evaluation   Cervical / Trunk Assessment Cervical / Trunk Assessment: Normal   Communication Communication Communication: No difficulties   Cognition Arousal/Alertness: Awake/alert Behavior During Therapy: WFL for tasks assessed/performed Overall Cognitive Status: Within Functional Limits for tasks assessed                                     General Comments       Exercises     Shoulder Instructions      Home Living Family/patient expects to be discharged to:: Private residence Living Arrangements: Alone Available Help at Discharge: Family;Available 24 hours/day (going to sister's house) Type of Home: House Home Access: Ramped entrance     Home Layout: One level     Bathroom Shower/Tub: Chief Strategy OfficerTub/shower unit   Bathroom Toilet: Standard     Home Equipment: Bedside commode;Cane - quad   Additional Comments: needs  new RW. Plans to stay with sister who has a  ramp and possibly a WC      Prior Functioning/Environment Level of Independence: Independent                 OT Problem List: Decreased strength;Decreased activity tolerance;Impaired balance (sitting and/or standing);Decreased knowledge of use of DME or AE;Pain      OT Treatment/Interventions:      OT Goals(Current goals can be found in the care plan section) Acute Rehab OT Goals Patient Stated Goal: to go stay with my sisiter.  OT Frequency:     Barriers to D/C:            Co-evaluation              AM-PAC PT "6 Clicks" Daily Activity     Outcome Measure Help from another person eating meals?: None Help from another person taking care of personal grooming?: A Little Help from another person toileting, which includes using toliet, bedpan, or urinal?: A Little Help from another person bathing (including washing, rinsing, drying)?: A Little Help from another person  to put on and taking off regular upper body clothing?: A Little Help from another person to put on and taking off regular lower body clothing?: A Little 6 Click Score: 19   End of Session Equipment Utilized During Treatment: Gait belt;Rolling walker Nurse Communication: Other (comment) (needs HHOT)  Activity Tolerance: Patient tolerated treatment well Patient left: in chair;with call bell/phone within reach  OT Visit Diagnosis: Unsteadiness on feet (R26.81);History of falling (Z91.81);Pain;Muscle weakness (generalized) (M62.81) Pain - Right/Left: Right Pain - part of body: Hip                Time: 1120-1155 OT Time Calculation (min): 35 min Charges:  OT General Charges $OT Visit: 1 Procedure OT Evaluation $OT Eval Moderate Complexity: 1 Procedure OT Treatments $Self Care/Home Management : 8-22 mins G-Codes:     Evern BioMayberry, Zora Glendenning Lynn 08/01/2016, 12:04 PM  [phone]

## 2016-08-01 NOTE — Discharge Summary (Addendum)
Physician Discharge Summary  Jamie Buckley ZOX:[id number] DOB: Nov 15, 1940 DOA: 07/30/2016  PCP: [name], MD  Admit date: 07/30/2016 Discharge date: 08/01/2016  Time spent: 45 minutes  Recommendations for Outpatient Follow-up:  Patient will be discharged to home with home health physical and occupational therapy.  Patient will need to follow up with primary care provider within one week of discharge, repeat CBC.  Follow up with orthopedics, Dr. Luiz Blare, in 2 weeks. Patient should continue medications as prescribed.  Patient should follow a heart healthy diet.   Discharge Diagnoses:  Right impacted femoral neck fracture Paroxysmal atrial fibrillation Hypertension Chronic kidney disease, stage III Headaches Depression/anxiety Hypokalemia Leukocytosis  Discharge Condition: Stable  Diet recommendation: heart healthy  Filed Weights   07/30/16 1351  Weight: 81.6 kg (180 lb)    History of present illness:  on 07/30/2016 by Dr. Vivia Budge a 76 y.o.femalewith history of hypertension, paroxysmal atrial fibrillation, depression and headaches had a fall 2 weeks ago following which patient has been having persistent right hip pain. Patient states she slipped and fell and did not lose her consciousness or hit her head. Patient's pain was persistent and patient came to the ER.  Hospital Course:  Right impacted femoral neck fracture -Occurred 2 weeks ago after sustaining a fall -X-ray showed impacted femoral neck fracture in valgus -Orthopedic surgery consultation appreciated -s/p cannulated hip pinning, right -Continue pain control -PT and OT consulted and recommended home health, rolling walker and 3-in-1 -Follow up with ortho in 2 weeks  Paroxysmal atrial fibrillation -Currently in sinus rhythm -Per chart review, patient currently not on any anticoagulation -Continue aspirin -CHADSVASC 3 (age, gender, HTN)  Hypertension -Per chart review, however  patient currently not on any medications at home -BP stable, med rec states unk BP med. -patient should follow up with PCP and discuss BP management  Normocytic anemia/anemia secondary to blood loss -likely due to recent surgery -Hemoglobin stable, currently 9.9 -unknown baseline -recheck CBC in one week  Chronic kidney disease, stage III -Upon admission, creatinine 1.5 . -Creatinine down to 1.14, GFR and stage III  -unknown baseline however per chart review, patient does have CKD stage III  Headaches -Continue tramadol, Robaxin  Depression/anxiety -Continue Lexapro, Klonopin  Hypokalemia -Resolved  Leukocytosis -Suspect reactive secondary to recent surgery -Patient denies any urinary or respiratory symptoms, currently afebrile -WBC normalized  Consultants Orthopedics  Procedures  Cannulated Pinning right hip  Discharge Exam: Vitals:   08/01/16 0530 08/01/16 1038  BP: 115/63 (!) 145/85  Pulse: 67 65  Resp: 16 16  Temp: 97.6 F (36.4 C)    Patient denies chest pain, shortness of breath, abdominal pain, nausea, vomiting, diarrhea, constipation. Feels leg pain has improved.    General: Well developed, well nourished, NAD, appears stated age  HEENT: NCAT, mucous membranes moist.  Cardiovascular: S1 S2 auscultated, RRR, no murmurs  Respiratory: Clear to auscultation bilaterally with equal chest rise  Abdomen: Soft, nontender, nondistended, + bowel sounds  Extremities: warm dry without cyanosis clubbing or edema, bandage on the right hip  Neuro: AAOx3, nonfocal  Psych: Normal affect and demeanor   Discharge Instructions Discharge Instructions    Discharge instructions    Complete by:  As directed    Patient will be discharged to home with home health physical and occupational therapy.  Patient will need to follow up with primary care provider within one week of discharge.  Follow up with orthopedics as directed. Patient should continue medications  as prescribed.  Patient  should follow a heart healthy diet.   Touch down weight bearing    Complete by:  As directed    Laterality:  right   Extremity:  Lower     Current Discharge Medication List    START taking these medications   Details  HYDROcodone-acetaminophen (NORCO) 5-325 MG tablet Take 1 tablet by mouth every 6 (six) hours as needed for moderate pain. Qty: 40 tablet, Refills: 0      CONTINUE these medications which have CHANGED   Details  aspirin EC 325 MG tablet Take 1 tablet (325 mg total) by mouth daily. Take x 1 month post op to decrease risk of blood clots. Qty: 60 tablet, Refills: 0      CONTINUE these medications which have NOT CHANGED   Details  Calcium-Vitamins C & D (CALCIUM/C/D PO) Take by mouth.    cholecalciferol (VITAMIN D) 1000 units tablet Take 1,000 Units by mouth daily.    clonazePAM (KLONOPIN) 2 MG tablet Take 2 mg by mouth daily.    escitalopram (LEXAPRO) 10 MG tablet Take 10 mg by mouth daily.    methocarbamol (ROBAXIN) 500 MG tablet Take 500 mg by mouth 4 (four) times daily.    omeprazole (PRILOSEC) 20 MG capsule Take 20 mg by mouth daily.    traMADol (ULTRAM) 50 MG tablet Take by mouth every 6 (six) hours as needed.    UNKNOWN TO PATIENT Blood Pressure pill with diuretic      STOP taking these medications     ibuprofen (ADVIL,MOTRIN) 200 MG tablet      meloxicam (MOBIC) 15 MG tablet        Allergies  Allergen Reactions  . Morphine And Related     Pt prefers not to have, but has no actual allergic rxn  . Dilaudid [Hydromorphone Hcl] Anxiety    Hallucinations    Follow-up Information    Jodi GeraldsGraves, John, MD. Schedule an appointment as soon as possible for a visit in 2 week(s).   Specialty:  Orthopedic Surgery Contact information: [address] [place] [address] [phone]        Laqueta DueFurr, Sara M., MD. Schedule an appointment as soon as possible for a visit in 1 week(s).   Specialty:  Internal Medicine Why:  Hospital  follow up Contact information: [address] [place] [address] [phone]            The results of significant diagnostics from this hospitalization (including imaging, microbiology, ancillary and laboratory) are listed below for reference.    Significant Diagnostic Studies: Dg C-arm 1-60 Min-no Report  Result Date: 07/30/2016 Fluoroscopy was utilized by the requesting physician.  No radiographic interpretation.   Dg Hip Operative Unilat W Or W/o Pelvis Right  Result Date: 07/30/2016 CLINICAL DATA:  Hip surgery EXAM: OPERATIVE right HIP (WITH PELVIS IF PERFORMED) 2 VIEWS TECHNIQUE: Fluoroscopic spot image(s) were submitted for interpretation post-operatively. COMPARISON:  07/30/2016 FINDINGS: Two low resolution intraoperative spot views of the right hip are submitted. Total fluoroscopy time was 1:00 26.8 seconds. The images demonstrate 3 screw fixation of the proximal right femur across a proximal femoral fracture. IMPRESSION: Intraoperative fluoroscopic assistance during right hip surgery Electronically Signed   By: Jasmine PangKim  Fujinaga M.D.   On: 07/30/2016 23:08   Dg Hip Unilat With Pelvis 2-3 Views Right  Result Date: 07/30/2016 CLINICAL DATA:  Larey SeatFell 2 weeks ago; inability to bear full weight. EXAM: DG HIP (WITH OR WITHOUT PELVIS) 2-3V RIGHT COMPARISON:  None. FINDINGS: There is  a subcapital fracture of the right hip. There is no pelvic fracture. The left hip is grossly normal. There are degenerative changes of the lower lumbar spine. IMPRESSION: There is a subcapital fracture of the right hip. Electronically Signed   By: David  [place] M.D.   On: 07/30/2016 16:34    Microbiology: Recent Results (from the past 240 hour(s))  MRSA PCR Screening     Status: None   Collection Time: 07/30/16 12:53 PM  Result Value Ref Range Status   MRSA by PCR NEGATIVE NEGATIVE Final    Comment:        The GeneXpert MRSA Assay (FDA approved for NASAL specimens only), is one component  of a comprehensive MRSA colonization surveillance program. It is not intended to diagnose MRSA infection nor to guide or monitor treatment for MRSA infections.      Labs: Basic Metabolic Panel:  Recent Labs Lab 07/30/16 1702 07/31/16 0117 08/01/16 0427  NA 135 137 136  K 3.1* 3.1* 3.8  CL 100* 102 101  CO2 26 26 27   GLUCOSE 103* 153* 115*  BUN 33* 30* 28*  CREATININE 1.51* 1.32* 1.14*  CALCIUM 10.0 9.1 8.7*   Liver Function Tests: No results for input(s): AST, ALT, ALKPHOS, BILITOT, PROT, ALBUMIN in the last 168 hours. No results for input(s): LIPASE, AMYLASE in the last 168 hours. No results for input(s): AMMONIA in the last 168 hours. CBC:  Recent Labs Lab 07/30/16 1702 07/31/16 0117 08/01/16 0427  WBC 7.4 12.7* 7.8  NEUTROABS 4.7  --   --   HGB 12.9 11.6* 9.9*  HCT 37.0 34.5* 30.2*  MCV 93.2 93.2 94.1  PLT 288 248 246   Cardiac Enzymes: No results for input(s): CKTOTAL, CKMB, CKMBINDEX, TROPONINI in the last 168 hours. BNP: BNP (last 3 results) No results for input(s): BNP in the last 8760 hours.  ProBNP (last 3 results) No results for input(s): PROBNP in the last 8760 hours.  CBG: No results for input(s): GLUCAP in the last 168 hours.     SignedGENNI, BUSKE  [facility] 08/01/2016, 12:09 PM

## 2016-08-01 NOTE — Progress Notes (Signed)
Discharge instructions reviewed with patient. Patiet verbalized understanding. Patient to be discharged via private vehicle.

## 2016-08-02 DIAGNOSIS — Z96641 Presence of right artificial hip joint: Secondary | ICD-10-CM | POA: Diagnosis not present

## 2016-08-02 DIAGNOSIS — I129 Hypertensive chronic kidney disease with stage 1 through stage 4 chronic kidney disease, or unspecified chronic kidney disease: Secondary | ICD-10-CM | POA: Diagnosis not present

## 2016-08-02 DIAGNOSIS — N183 Chronic kidney disease, stage 3 (moderate): Secondary | ICD-10-CM | POA: Diagnosis not present

## 2016-08-02 DIAGNOSIS — S72001D Fracture of unspecified part of neck of right femur, subsequent encounter for closed fracture with routine healing: Secondary | ICD-10-CM | POA: Diagnosis not present

## 2016-08-02 DIAGNOSIS — Z79891 Long term (current) use of opiate analgesic: Secondary | ICD-10-CM | POA: Diagnosis not present

## 2016-08-02 DIAGNOSIS — Z7982 Long term (current) use of aspirin: Secondary | ICD-10-CM | POA: Diagnosis not present

## 2016-08-02 DIAGNOSIS — Z791 Long term (current) use of non-steroidal anti-inflammatories (NSAID): Secondary | ICD-10-CM | POA: Diagnosis not present

## 2016-08-02 DIAGNOSIS — K219 Gastro-esophageal reflux disease without esophagitis: Secondary | ICD-10-CM | POA: Diagnosis not present

## 2016-08-02 DIAGNOSIS — I739 Peripheral vascular disease, unspecified: Secondary | ICD-10-CM | POA: Diagnosis not present

## 2016-08-02 NOTE — Anesthesia Postprocedure Evaluation (Signed)
Anesthesia Post Note  Patient: Alvira PhilipsMary Mireles  Procedure(s) Performed: Procedure(s) (LRB): CANNULATED HIP PINNING (Right)  Patient location during evaluation: PACU Anesthesia Type: General Level of consciousness: awake and alert Pain management: pain level controlled Vital Signs Assessment: post-procedure vital signs reviewed and stable Respiratory status: spontaneous breathing, nonlabored ventilation, respiratory function stable and patient connected to nasal cannula oxygen Cardiovascular status: blood pressure returned to baseline and stable Postop Assessment: no signs of nausea or vomiting Anesthetic complications: no       Last Vitals:  Vitals:   08/01/16 0530 08/01/16 1431  BP: 115/63 118/66  Pulse: 67 75  Resp: 16 16  Temp: 36.4 C 36.4 C    Last Pain:  Vitals:   08/01/16 1611  TempSrc:   PainSc: 0-No pain                 Kennieth RadFitzgerald, Aleila Syverson E

## 2016-08-04 DIAGNOSIS — S72001D Fracture of unspecified part of neck of right femur, subsequent encounter for closed fracture with routine healing: Secondary | ICD-10-CM | POA: Diagnosis not present

## 2016-08-04 DIAGNOSIS — N183 Chronic kidney disease, stage 3 (moderate): Secondary | ICD-10-CM | POA: Diagnosis not present

## 2016-08-04 DIAGNOSIS — I739 Peripheral vascular disease, unspecified: Secondary | ICD-10-CM | POA: Diagnosis not present

## 2016-08-04 DIAGNOSIS — Z79891 Long term (current) use of opiate analgesic: Secondary | ICD-10-CM | POA: Diagnosis not present

## 2016-08-04 DIAGNOSIS — Z791 Long term (current) use of non-steroidal anti-inflammatories (NSAID): Secondary | ICD-10-CM | POA: Diagnosis not present

## 2016-08-04 DIAGNOSIS — K219 Gastro-esophageal reflux disease without esophagitis: Secondary | ICD-10-CM | POA: Diagnosis not present

## 2016-08-04 DIAGNOSIS — Z7982 Long term (current) use of aspirin: Secondary | ICD-10-CM | POA: Diagnosis not present

## 2016-08-04 DIAGNOSIS — I129 Hypertensive chronic kidney disease with stage 1 through stage 4 chronic kidney disease, or unspecified chronic kidney disease: Secondary | ICD-10-CM | POA: Diagnosis not present

## 2016-08-04 DIAGNOSIS — Z96641 Presence of right artificial hip joint: Secondary | ICD-10-CM | POA: Diagnosis not present

## 2016-08-13 DIAGNOSIS — S72091D Other fracture of head and neck of right femur, subsequent encounter for closed fracture with routine healing: Secondary | ICD-10-CM | POA: Diagnosis not present

## 2016-08-14 DIAGNOSIS — Z791 Long term (current) use of non-steroidal anti-inflammatories (NSAID): Secondary | ICD-10-CM | POA: Diagnosis not present

## 2016-08-14 DIAGNOSIS — I739 Peripheral vascular disease, unspecified: Secondary | ICD-10-CM | POA: Diagnosis not present

## 2016-08-14 DIAGNOSIS — I129 Hypertensive chronic kidney disease with stage 1 through stage 4 chronic kidney disease, or unspecified chronic kidney disease: Secondary | ICD-10-CM | POA: Diagnosis not present

## 2016-08-14 DIAGNOSIS — Z7982 Long term (current) use of aspirin: Secondary | ICD-10-CM | POA: Diagnosis not present

## 2016-08-14 DIAGNOSIS — Z96641 Presence of right artificial hip joint: Secondary | ICD-10-CM | POA: Diagnosis not present

## 2016-08-14 DIAGNOSIS — K219 Gastro-esophageal reflux disease without esophagitis: Secondary | ICD-10-CM | POA: Diagnosis not present

## 2016-08-14 DIAGNOSIS — Z79891 Long term (current) use of opiate analgesic: Secondary | ICD-10-CM | POA: Diagnosis not present

## 2016-08-14 DIAGNOSIS — S72001D Fracture of unspecified part of neck of right femur, subsequent encounter for closed fracture with routine healing: Secondary | ICD-10-CM | POA: Diagnosis not present

## 2016-08-14 DIAGNOSIS — N183 Chronic kidney disease, stage 3 (moderate): Secondary | ICD-10-CM | POA: Diagnosis not present

## 2016-09-01 DIAGNOSIS — M1631 Unilateral osteoarthritis resulting from hip dysplasia, right hip: Secondary | ICD-10-CM | POA: Diagnosis not present

## 2016-09-03 DIAGNOSIS — S72091D Other fracture of head and neck of right femur, subsequent encounter for closed fracture with routine healing: Secondary | ICD-10-CM | POA: Diagnosis not present

## 2016-10-01 DIAGNOSIS — M1631 Unilateral osteoarthritis resulting from hip dysplasia, right hip: Secondary | ICD-10-CM | POA: Diagnosis not present

## 2016-10-22 DIAGNOSIS — M545 Low back pain: Secondary | ICD-10-CM | POA: Diagnosis not present

## 2016-10-22 DIAGNOSIS — M25551 Pain in right hip: Secondary | ICD-10-CM | POA: Diagnosis not present

## 2016-10-22 DIAGNOSIS — Z9889 Other specified postprocedural states: Secondary | ICD-10-CM | POA: Diagnosis not present

## 2016-11-01 DIAGNOSIS — M1631 Unilateral osteoarthritis resulting from hip dysplasia, right hip: Secondary | ICD-10-CM | POA: Diagnosis not present

## 2016-12-02 DIAGNOSIS — M1631 Unilateral osteoarthritis resulting from hip dysplasia, right hip: Secondary | ICD-10-CM | POA: Diagnosis not present

## 2016-12-15 ENCOUNTER — Emergency Department (HOSPITAL_COMMUNITY): Payer: PPO

## 2016-12-15 ENCOUNTER — Observation Stay (HOSPITAL_COMMUNITY)
Admission: EM | Admit: 2016-12-15 | Discharge: 2016-12-16 | Disposition: A | Discharge status: Home / Self Care | Payer: PPO | Attending: Internal Medicine | Admitting: Internal Medicine

## 2016-12-15 DIAGNOSIS — I480 Paroxysmal atrial fibrillation: Secondary | ICD-10-CM | POA: Insufficient documentation

## 2016-12-15 DIAGNOSIS — Z8249 Family history of ischemic heart disease and other diseases of the circulatory system: Secondary | ICD-10-CM | POA: Insufficient documentation

## 2016-12-15 DIAGNOSIS — F411 Generalized anxiety disorder: Secondary | ICD-10-CM | POA: Diagnosis not present

## 2016-12-15 DIAGNOSIS — R531 Weakness: Secondary | ICD-10-CM | POA: Diagnosis not present

## 2016-12-15 DIAGNOSIS — N183 Chronic kidney disease, stage 3 (moderate): Secondary | ICD-10-CM

## 2016-12-15 DIAGNOSIS — M25519 Pain in unspecified shoulder: Secondary | ICD-10-CM | POA: Diagnosis not present

## 2016-12-15 DIAGNOSIS — F329 Major depressive disorder, single episode, unspecified: Secondary | ICD-10-CM | POA: Diagnosis not present

## 2016-12-15 DIAGNOSIS — R9431 Abnormal electrocardiogram [ECG] [EKG]: Secondary | ICD-10-CM | POA: Diagnosis not present

## 2016-12-15 DIAGNOSIS — J9811 Atelectasis: Secondary | ICD-10-CM | POA: Diagnosis not present

## 2016-12-15 DIAGNOSIS — Z79899 Other long term (current) drug therapy: Secondary | ICD-10-CM

## 2016-12-15 DIAGNOSIS — E876 Hypokalemia: Principal | ICD-10-CM

## 2016-12-15 DIAGNOSIS — R404 Transient alteration of awareness: Secondary | ICD-10-CM | POA: Diagnosis not present

## 2016-12-15 DIAGNOSIS — M542 Cervicalgia: Secondary | ICD-10-CM | POA: Diagnosis not present

## 2016-12-15 DIAGNOSIS — M5416 Radiculopathy, lumbar region: Secondary | ICD-10-CM | POA: Diagnosis not present

## 2016-12-15 DIAGNOSIS — R55 Syncope and collapse: Secondary | ICD-10-CM

## 2016-12-15 DIAGNOSIS — Z885 Allergy status to narcotic agent status: Secondary | ICD-10-CM

## 2016-12-15 DIAGNOSIS — R112 Nausea with vomiting, unspecified: Secondary | ICD-10-CM | POA: Diagnosis not present

## 2016-12-15 DIAGNOSIS — Z7982 Long term (current) use of aspirin: Secondary | ICD-10-CM | POA: Insufficient documentation

## 2016-12-15 DIAGNOSIS — Z23 Encounter for immunization: Secondary | ICD-10-CM | POA: Insufficient documentation

## 2016-12-15 DIAGNOSIS — G8929 Other chronic pain: Secondary | ICD-10-CM | POA: Diagnosis not present

## 2016-12-15 DIAGNOSIS — K219 Gastro-esophageal reflux disease without esophagitis: Secondary | ICD-10-CM | POA: Diagnosis not present

## 2016-12-15 DIAGNOSIS — K210 Gastro-esophageal reflux disease with esophagitis: Secondary | ICD-10-CM | POA: Insufficient documentation

## 2016-12-15 DIAGNOSIS — I129 Hypertensive chronic kidney disease with stage 1 through stage 4 chronic kidney disease, or unspecified chronic kidney disease: Secondary | ICD-10-CM | POA: Diagnosis not present

## 2016-12-15 DIAGNOSIS — R42 Dizziness and giddiness: Secondary | ICD-10-CM | POA: Diagnosis not present

## 2016-12-15 DIAGNOSIS — I4581 Long QT syndrome: Secondary | ICD-10-CM | POA: Diagnosis not present

## 2016-12-15 LAB — COMPREHENSIVE METABOLIC PANEL
ALT: 18 U/L (ref 14–54)
AST: 24 U/L (ref 15–41)
Albumin: 3.9 g/dL (ref 3.5–5.0)
Alkaline Phosphatase: 59 U/L (ref 38–126)
Anion gap: 13 (ref 5–15)
BUN: 19 mg/dL (ref 6–20)
CO2: 23 mmol/L (ref 22–32)
Calcium: 9.6 mg/dL (ref 8.9–10.3)
Chloride: 101 mmol/L (ref 101–111)
Creatinine, Ser: 1.24 mg/dL — ABNORMAL HIGH (ref 0.44–1.00)
GFR calc Af Amer: 48 mL/min — ABNORMAL LOW (ref >60)
GFR calc non Af Amer: 42 mL/min — ABNORMAL LOW (ref >60)
Glucose, Bld: 128 mg/dL — ABNORMAL HIGH (ref 65–99)
Potassium: 2.7 mmol/L — CL (ref 3.5–5.1)
Sodium: 137 mmol/L (ref 135–145)
Total Bilirubin: 1.2 mg/dL (ref 0.3–1.2)
Total Protein: 6.4 g/dL — ABNORMAL LOW (ref 6.5–8.1)

## 2016-12-15 LAB — CBC WITH DIFFERENTIAL/PLATELET
BASOS ABS: 0 10*3/uL (ref 0.0–0.1)
BASOS PCT: 0 %
EOS ABS: 0 10*3/uL (ref 0.0–0.7)
Eosinophils Relative: 0 %
HEMATOCRIT: 39.2 % (ref 36.0–46.0)
HEMOGLOBIN: 14 g/dL (ref 12.0–15.0)
Lymphocytes Relative: 17 %
Lymphs Abs: 1.6 10*3/uL (ref 0.7–4.0)
MCH: 32.9 pg (ref 26.0–34.0)
MCHC: 35.7 g/dL (ref 30.0–36.0)
MCV: 92 fL (ref 78.0–100.0)
Monocytes Absolute: 0.5 10*3/uL (ref 0.1–1.0)
Monocytes Relative: 6 %
NEUTROS ABS: 7.4 10*3/uL (ref 1.7–7.7)
NEUTROS PCT: 77 %
Platelets: 285 10*3/uL (ref 150–400)
RBC: 4.26 MIL/uL (ref 3.87–5.11)
RDW: 12.9 % (ref 11.5–15.5)
WBC: 9.5 10*3/uL (ref 4.0–10.5)

## 2016-12-15 LAB — BASIC METABOLIC PANEL
Anion gap: 9 (ref 5–15)
BUN: 16 mg/dL (ref 6–20)
CO2: 24 mmol/L (ref 22–32)
Calcium: 8.8 mg/dL — ABNORMAL LOW (ref 8.9–10.3)
Chloride: 103 mmol/L (ref 101–111)
Creatinine, Ser: 1.07 mg/dL — ABNORMAL HIGH (ref 0.44–1.00)
GFR calc Af Amer: 58 mL/min — ABNORMAL LOW (ref >60)
GFR calc non Af Amer: 50 mL/min — ABNORMAL LOW (ref >60)
Glucose, Bld: 136 mg/dL — ABNORMAL HIGH (ref 65–99)
Potassium: 3.3 mmol/L — ABNORMAL LOW (ref 3.5–5.1)
Sodium: 136 mmol/L (ref 135–145)

## 2016-12-15 LAB — URINALYSIS, ROUTINE W REFLEX MICROSCOPIC
Glucose, UA: NEGATIVE mg/dL
HGB URINE DIPSTICK: NEGATIVE
Ketones, ur: NEGATIVE mg/dL
NITRITE: NEGATIVE
Protein, ur: NEGATIVE mg/dL
SPECIFIC GRAVITY, URINE: 1.023 (ref 1.005–1.030)
SQUAMOUS EPITHELIAL / LPF: NONE SEEN
pH: 5 (ref 5.0–8.0)

## 2016-12-15 LAB — I-STAT CG4 LACTIC ACID, ED
LACTIC ACID, VENOUS: 2.39 mmol/L — AB (ref 0.5–1.9)
Lactic Acid, Venous: 1.06 mmol/L (ref 0.5–1.9)

## 2016-12-15 LAB — TROPONIN I

## 2016-12-15 LAB — MAGNESIUM
MAGNESIUM: 1.1 mg/dL — AB (ref 1.7–2.4)
Magnesium: 1.6 mg/dL — ABNORMAL LOW (ref 1.7–2.4)

## 2016-12-15 MED ORDER — SODIUM CHLORIDE 0.9 % IV BOLUS (SEPSIS)
1000.0000 mL | Freq: Once | INTRAVENOUS | Status: AC
  Administered 2016-12-15: 1000 mL via INTRAVENOUS

## 2016-12-15 MED ORDER — POTASSIUM CHLORIDE CRYS ER 20 MEQ PO TBCR
40.0000 meq | EXTENDED_RELEASE_TABLET | Freq: Once | ORAL | Status: AC
  Administered 2016-12-15: 40 meq via ORAL
  Filled 2016-12-15: qty 2

## 2016-12-15 MED ORDER — HYDROCHLOROTHIAZIDE 25 MG PO TABS: 25.0000 mg | ORAL_TABLET | Freq: Every day | ORAL | Status: DC

## 2016-12-15 MED ORDER — LISINOPRIL-HYDROCHLOROTHIAZIDE 20-25 MG PO TABS: 1.0000 | ORAL_TABLET | Freq: Every day | ORAL | Status: DC

## 2016-12-15 MED ORDER — ASPIRIN EC 81 MG PO TBEC
81.0000 mg | DELAYED_RELEASE_TABLET | Freq: Every day | ORAL | Status: DC
  Administered 2016-12-15 – 2016-12-16 (×2): 81 mg via ORAL
  Filled 2016-12-15 (×2): qty 1

## 2016-12-15 MED ORDER — SODIUM CHLORIDE 0.9 % IV SOLN
INTRAVENOUS | Status: AC
  Administered 2016-12-15: 23:00:00 via INTRAVENOUS

## 2016-12-15 MED ORDER — PANTOPRAZOLE SODIUM 40 MG PO TBEC
40.0000 mg | DELAYED_RELEASE_TABLET | Freq: Every day | ORAL | Status: DC
  Administered 2016-12-15 – 2016-12-16 (×2): 40 mg via ORAL
  Filled 2016-12-15 (×2): qty 1

## 2016-12-15 MED ORDER — POTASSIUM CHLORIDE 10 MEQ/100ML IV SOLN
10.0000 meq | INTRAVENOUS | Status: AC
  Administered 2016-12-15 (×3): 10 meq via INTRAVENOUS
  Filled 2016-12-15 (×3): qty 100

## 2016-12-15 MED ORDER — MAGNESIUM SULFATE 2 GM/50ML IV SOLN
2.0000 g | Freq: Once | INTRAVENOUS | Status: AC
  Administered 2016-12-16: 2 g via INTRAVENOUS
  Filled 2016-12-15: qty 50

## 2016-12-15 MED ORDER — POTASSIUM CHLORIDE CRYS ER 20 MEQ PO TBCR
40.0000 meq | EXTENDED_RELEASE_TABLET | Freq: Once | ORAL | Status: AC
  Administered 2016-12-16: 40 meq via ORAL
  Filled 2016-12-15: qty 2

## 2016-12-15 MED ORDER — ENOXAPARIN SODIUM 40 MG/0.4ML ~~LOC~~ SOLN
40.0000 mg | SUBCUTANEOUS | Status: DC
  Administered 2016-12-15: 40 mg via SUBCUTANEOUS
  Filled 2016-12-15: qty 0.4

## 2016-12-15 MED ORDER — MAGNESIUM SULFATE 2 GM/50ML IV SOLN
2.0000 g | Freq: Once | INTRAVENOUS | Status: AC
  Administered 2016-12-15: 2 g via INTRAVENOUS
  Filled 2016-12-15: qty 50

## 2016-12-15 MED ORDER — LISINOPRIL 20 MG PO TABS: 20.0000 mg | ORAL_TABLET | Freq: Every day | ORAL | Status: DC

## 2016-12-15 NOTE — ED Notes (Signed)
Date and time results received: 12/15/16 3:04 PM  Test: potassium Critical Value: 2.7  Name of Provider Notified: Marily Memos MD

## 2016-12-15 NOTE — ED Triage Notes (Signed)
Pt arrives to ED from PCP office with complaints of syncope while in the shower this morning; pt states she did not hit her head. Pt states she has had generalized weakness x2 weeks but "has just not felt the same since her partial hip replacement in May of 2018. Pt also reports N/V this morninng. Pt placed in position of comfort with bed locked and lowered, call bell in reach.

## 2016-12-15 NOTE — ED Provider Notes (Signed)
[facility] DEPT Provider Note   CSN: [id number] Arrival date & time: 12/15/16  1329     History   Chief Complaint Chief Complaint  Patient presents with  . Fall  . Weakness    HPI Jamie Buckley is a 76 y.o. female.   Fall  This is a new problem. The current episode started 1 to 2 hours ago. The problem occurs constantly. The problem has not changed since onset.Pertinent negatives include no chest pain, no abdominal pain, no headaches and no shortness of breath. Nothing aggravates the symptoms. Nothing relieves the symptoms. She has tried nothing for the symptoms.  Weakness  Primary symptoms include dizziness. Associated symptoms include vomiting (after syncope). Pertinent negatives include no shortness of breath, no chest pain and no headaches.    Past Medical History:  Diagnosis Date  . Hypertension   . Reflux esophagitis     Patient Active Problem List   Diagnosis Date Noted  . Closed right hip fracture, initial encounter ([place]) 07/30/2016  . PAF (paroxysmal atrial fibrillation) ([place]) 07/30/2016  . CKD (chronic kidney disease) stage 3, GFR 30-59 ml/min ([facility]) 07/30/2016  . Hip fracture ([place]) 07/30/2016  . Fracture of femoral neck, right ([place]) 07/30/2016    Past Surgical History:  Procedure Laterality Date  . FOOT SURGERY     pinning 2nd metatarsal  . HIP PINNING,CANNULATED Right 07/30/2016   Procedure: CANNULATED HIP PINNING;  Surgeon: Dorna Leitz, MD;  Location: [facility] ORS;  Service: Orthopedics;  Laterality: Right;  . THORACIC DISC SURGERY     s/p fall 12th thoracic fracture.     OB History    No data available       Home Medications    Prior to Admission medications   Medication Sig Start Date End Date Taking? Authorizing Provider  aspirin EC 81 MG tablet Take 81 mg by mouth daily.   Yes [provider]  Calcium-Vitamin D-Vitamin K (VIACTIV) 619-509-32 MG-UNT-MCG CHEW Chew 3 tablets by mouth daily.   Yes [provider]    Calcium-Vitamins C & D (CALCIUM/C/D PO) Take 1 tablet by mouth daily.    Yes [provider]  cholecalciferol (VITAMIN D) 1000 units tablet Take 1,000 Units by mouth daily.   Yes [provider]  clonazePAM (KLONOPIN) 2 MG tablet Take 1 mg by mouth 2 (two) times daily as needed for anxiety.  06/09/16  Yes [provider]  lisinopril-hydrochlorothiazide (PRINZIDE,ZESTORETIC) 20-25 MG tablet Take 1 tablet by mouth daily.   Yes [provider]  methocarbamol (ROBAXIN) 750 MG tablet Take 750 mg by mouth 4 (four) times daily as needed for muscle spasms.    Yes [provider]  Multiple Vitamins-Minerals (ONE-A-DAY WOMENS 50+ ADVANTAGE) TABS Take 1 tablet by mouth daily.   Yes [provider]  omeprazole (PRILOSEC) 20 MG capsule Take 20 mg by mouth daily. 01/23/16  Yes [provider]  traMADol (ULTRAM) 50 MG tablet Take 50 mg by mouth every 6 (six) hours as needed (for pain).    Yes [provider]  aspirin EC 325 MG tablet Take 1 tablet (325 mg total) by mouth daily. Take x 1 month post op to decrease risk of blood clots. Patient not taking: Reported on 12/15/2016 07/30/16   Gary Fleet, PA-C  escitalopram (LEXAPRO) 20 MG tablet Take 20 mg by mouth daily. 10/10/16   [provider]  HYDROcodone-acetaminophen (NORCO) 5-325 MG tablet Take 1 tablet by mouth every 6 (six) hours as needed for moderate pain. Patient  not taking: Reported on 12/15/2016 07/30/16   Gary Fleet, PA-C    Family History Family History  Problem Relation Age of Onset  . Multiple myeloma Mother   . CAD Paternal Uncle     Social History Social History  Substance Use Topics  . Smoking status: Never Smoker  . Smokeless tobacco: Never Used  . Alcohol use No     Allergies   Morphine and related and Dilaudid [hydromorphone hcl]   Review of Systems Review of Systems  Constitutional: Positive for appetite change and fatigue. Negative for  activity change and fever.  Respiratory: Negative for shortness of breath.   Cardiovascular: Negative for chest pain.  Gastrointestinal: Positive for vomiting (after syncope). Negative for abdominal pain.  Neurological: Positive for dizziness, syncope and weakness. Negative for seizures, numbness and headaches.  All other systems reviewed and are negative.    Physical Exam Updated Vital Signs BP (!) 166/87   Pulse 82   Temp 98.6 F (37 C) (Oral)   Resp 13   Ht 5' 7"  (1.702 m)   Wt 78 kg (172 lb)   SpO2 97%   BMI 26.94 kg/m   Physical Exam  Constitutional: She is oriented to person, place, and time. She appears well-developed and well-nourished.  HENT:  Head: Normocephalic and atraumatic.  Eyes: Conjunctivae and EOM are normal.  Neck: Normal range of motion.  Cardiovascular: Normal rate and regular rhythm.   Pulmonary/Chest: Effort normal and breath sounds normal. No stridor. No respiratory distress.  Abdominal: Soft. She exhibits no distension.  Musculoskeletal: Normal range of motion. She exhibits no edema or deformity.  Neurological: She is alert and oriented to person, place, and time. She displays normal reflexes. No cranial nerve deficit. Coordination normal.  Skin: Skin is warm and dry.  Nursing note and vitals reviewed.    ED Treatments / Results  Labs (all labs ordered are listed, but only abnormal results are displayed) Labs Reviewed  COMPREHENSIVE METABOLIC PANEL - Abnormal; Notable for the following:       Result Value   Potassium 2.7 (*)    Glucose, Bld 128 (*)    Creatinine, Ser 1.24 (*)    Total Protein 6.4 (*)    GFR calc non Af Amer 42 (*)    GFR calc Af Amer 48 (*)    All other components within normal limits  URINALYSIS, ROUTINE W REFLEX MICROSCOPIC - Abnormal; Notable for the following:    APPearance HAZY (*)    Bilirubin Urine SMALL (*)    Leukocytes, UA MODERATE (*)    Bacteria, UA RARE (*)    All other components within normal limits    MAGNESIUM - Abnormal; Notable for the following:    Magnesium 1.1 (*)    All other components within normal limits  I-STAT CG4 LACTIC ACID, ED - Abnormal; Notable for the following:    Lactic Acid, Venous 2.39 (*)    All other components within normal limits  URINE CULTURE  CBC WITH DIFFERENTIAL/PLATELET  TROPONIN I  I-STAT CG4 LACTIC ACID, ED    EKG  EKG Interpretation  Date/Time:  Tuesday December 15 2016 13:43:37 EDT Ventricular Rate:  79 PR Interval:    QRS Duration: 91 QT Interval:  442 QTC Calculation: 507 R Axis:   -15 Text Interpretation:  Sinus rhythm Borderline left axis deviation Anteroseptal infarct, age indeterminate Lateral leads are also involved Prolonged QT interval T wave changes in inferior leads and anterolateral leads are new since 5/18 Confirmed by  Renea Schoonmaker, Corene Cornea [phone]) on 12/15/2016 1:50:17 PM       Radiology Dg Chest 2 View  Result Date: 12/15/2016 CLINICAL DATA:  Weakness for 2 weeks EXAM: CHEST  2 VIEW COMPARISON:  None. FINDINGS: Normal heart size. There is elevation of the right hemidiaphragm with basilar atelectasis. Lungs otherwise clear. Normal vascularity. No pneumothorax or pleural effusion. Status post vertebral augmentation has been performed at the thoracolumbar junction. Just above the treated level, there is a compression fracture with 20% loss of height. IMPRESSION: Elevation of the right hemidiaphragm with basilar atelectasis. Lower thoracic compression deformity of indeterminate age is adjacent to a previously treated vertebral body with bone cement. MRI can be performed to further delineate. Electronically Signed   By: Marybelle Killings M.D.   On: 12/15/2016 14:38    Procedures Procedures (including critical care time)  CRITICAL CARE Performed by: Merrily Pew Total critical care time: 35 minutes Critical care time was exclusive of separately billable procedures and treating other patients. Critical care was necessary to treat or prevent  imminent or life-threatening deterioration. Critical care was time spent personally by me on the following activities: development of treatment plan with patient and/or surrogate as well as nursing, discussions with consultants, evaluation of patient's response to treatment, examination of patient, obtaining history from patient or surrogate, ordering and performing treatments and interventions, ordering and review of laboratory studies, ordering and review of radiographic studies, pulse oximetry and re-evaluation of patient's condition.   Medications Ordered in ED Medications  potassium chloride 10 mEq in 100 mL IVPB (10 mEq Intravenous New Bag/Given 12/15/16 1519)  sodium chloride 0.9 % bolus 1,000 mL (1,000 mLs Intravenous New Bag/Given 12/15/16 1442)  potassium chloride SA (K-DUR,KLOR-CON) CR tablet 40 mEq (40 mEq Oral Given 12/15/16 1518)  magnesium sulfate IVPB 2 g 50 mL (2 g Intravenous New Bag/Given 12/15/16 1519)     Initial Impression / Assessment and Plan / ED Course  I have reviewed the triage vital signs and the nursing notes.  Pertinent labs & imaging results that were available during my care of the patient were reviewed by me and considered in my medical decision making (see chart for details).   Patient found to have severe hypokalemia and hypomagnesemia and a prolonged QTc concerning for possible torsades as the cause of her syncope. Suspect these are likely secondary decreased by mouth intake. IV potassium and IV making these and given emergency room.. Discussed with the medicine team who will admit to telemetry for further workup and management.  Final Clinical Impressions(s) / ED Diagnoses   Final diagnoses:  Prolonged Q-T interval on ECG  Hypokalemia  Syncope, unspecified syncope type  Hypomagnesemia    New Prescriptions New Prescriptions   No medications on file     Marx Doig, Corene Cornea, MD 12/15/16 [phone]

## 2016-12-15 NOTE — ED Notes (Signed)
Pt states "I feel better just being here, those fluids really helped too".

## 2016-12-15 NOTE — ED Notes (Signed)
Patient transported to X-ray 

## 2016-12-15 NOTE — ED Notes (Signed)
Admitting Providers at bedside. 

## 2016-12-15 NOTE — H&P (Signed)
Date: 12/15/2016               Patient Name:  Jamie Buckley MRN: [id number]  DOB: 1940/05/06 Age / Sex: 76 y.o., female   PCP: Karleen Hampshire., MD         Medical Service: Internal Medicine Teaching Service         Attending Physician: Dr. Rebeca Alert Raynaldo Opitz, MD    First Contact: Dr. Trilby Drummer Pager: [phone]  Second Contact: Dr. Heber [place] Pager: [phone]       After Hours (After 5p/  First Contact Pager: [phone]  weekends / holidays): Second Contact Pager: [phone]   Chief Complaint: Syncope  History of Present Illness: Ms. Peery is a 76 yo F with a history of CKD 3, Hip fracture, HTN, GERD, Generalized anxiety disorder, depression and Lumbar radiculopathy who presented from her PCP after having a syncopal episode earlier this morning. She experienced a syncopal episode while in the shower this morning. She states that she awoke on the ground in the shower with the water still running. She states that she did not hit her head and does not have any areas that are sore from the fall. She thinks she may have slid to the ground. She endorses a rapid heart rate prior to the event and an episode of nausea following the event. She states she has been feeling better while in the ED after receiving fluids and potassium.  She states that she feels she never recovered from her hip surgery in may '18 and has had difficulty with chronic pain in her back, leg and foot exacerbated by activity; which has prevented her from getting out much. In the last 2 weeks, she has been feeling episodes of feeling "shaky, inside and out", which is associated with anxiety and seems to be worsened with activity. In this time she has also been experiencing lightheaded on standing. In the last 3-4 days, she has been experiencing intermittent nausea with decrease in her oral intake as well as a decrease in sleep (only sleeping about 2hr/night for the last week).  In the ED, BP 140s-160s/60s-80s, HR 70-80s, RR 18-24, 98% RA.  CBC WNL; BMP showed K 2.7, Cr 1.24 (baseline); Troponin Negative; Lactate 2.39, 1.06; U/A showed small bilirubin, mod leukocytes, rare bacteria; Mg; and urine culture was obtained. EKG showed SR @ 79 bpm and prolonged QT. CXR Showed elevation of the right hemidiaphragm with basilar atelectasis. She received 1L IVF, 40 mEq PO Potassium, 30 mEq IV Potassium, and 2g Magnesium. Patient to be admitted for further workup and treatment.  Meds:  Current Meds  Medication Sig  . aspirin EC 81 MG tablet Take 81 mg by mouth daily.  . Calcium-Vitamin D-Vitamin K (VIACTIV) 117-356-70 MG-UNT-MCG CHEW Chew 3 tablets by mouth daily.  . Calcium-Vitamins C & D (CALCIUM/C/D PO) Take 1 tablet by mouth daily.   . cholecalciferol (VITAMIN D) 1000 units tablet Take 1,000 Units by mouth daily.  . clonazePAM (KLONOPIN) 2 MG tablet Take 1 mg by mouth 2 (two) times daily as needed for anxiety.   Marland Kitchen lisinopril-hydrochlorothiazide (PRINZIDE,ZESTORETIC) 20-25 MG tablet Take 1 tablet by mouth daily.  . methocarbamol (ROBAXIN) 750 MG tablet Take 750 mg by mouth 4 (four) times daily as needed for muscle spasms.   . Multiple Vitamins-Minerals (ONE-A-DAY WOMENS 50+ ADVANTAGE) TABS Take 1 tablet by mouth daily.  Marland Kitchen omeprazole (PRILOSEC) 20 MG capsule Take 20 mg by mouth daily.  . traMADol (ULTRAM) 50 MG tablet Take  50 mg by mouth every 6 (six) hours as needed (for pain).      Allergies: Allergies as of 12/15/2016 - Review Complete 12/15/2016  Allergen Reaction Noted  . Morphine and related Other (See Comments) 07/30/2016  . Dilaudid [hydromorphone hcl] Anxiety 07/30/2016   Past Medical History:  Diagnosis Date  . Hypertension   . Reflux esophagitis     Family History:  Family History  Problem Relation Age of Onset  . Multiple myeloma Mother   . CAD Paternal Uncle     Social History:  Social History  Substance Use Topics  . Smoking status: Never Smoker  . Smokeless tobacco: Never Used  . Alcohol use No     Review of Systems: A complete ROS was negative except as per HPI.   Physical Exam: Blood pressure (!) 89/71, pulse 91, temperature 98.6 F (37 C), temperature source Oral, resp. rate 20, height _0  (1.702 m), weight 177 lb 11.2 oz (80.6 kg), SpO2 99 %. Physical Exam  Constitutional: She is oriented to person, place, and time. She appears well-developed and well-nourished.  HENT:  Head: Normocephalic and atraumatic.  Eyes: EOM are normal. Right eye exhibits no discharge. Left eye exhibits no discharge.  Cardiovascular: Normal rate, regular rhythm, normal heart sounds and intact distal pulses.   Pulmonary/Chest: Effort normal and breath sounds normal. No respiratory distress.  Abdominal: Soft. Bowel sounds are normal. She exhibits no distension. There is no tenderness.  Musculoskeletal: She exhibits no edema or deformity.  Neurological: She is alert and oriented to person, place, and time.  Skin: Skin is warm and dry.  Psychiatric: She has a normal mood and affect.   CBC Latest Ref Rng & Units 12/15/2016 08/01/2016 07/31/2016  WBC 4.0 - 10.5 K/uL 9.5 7.8 12.7(H)  Hemoglobin 12.0 - 15.0 g/dL 14.0 9.9(L) 11.6(L)  Hematocrit 36.0 - 46.0 % 39.2 30.2(L) 34.5(L)  Platelets 150 - 400 K/uL 285 246 248   BMP Latest Ref Rng & Units 12/15/2016 08/01/2016 07/31/2016  Glucose 65 - 99 mg/dL 128(H) 115(H) 153(H)  BUN 6 - 20 mg/dL 19 28(H) 30(H)  Creatinine 0.44 - 1.00 mg/dL 1.24(H) 1.14(H) 1.32(H)  Sodium 135 - 145 mmol/L 137 136 137  Potassium 3.5 - 5.1 mmol/L 2.7(LL) 3.8 3.1(L)  Chloride 101 - 111 mmol/L 101 101 102  CO2 22 - 32 mmol/L _1 Calcium 8.9 - 10.3 mg/dL 9.6 8.7(L) 9.1   EKG: Sinus rhythm Borderline left axis deviation Anteroseptal infarct, age indeterminate Lateral leads are also involved Prolonged QT interval T wave changes in inferior leads and anterolateral leads are new since 5/18  CXR: IMPRESSION: - Elevation of the right hemidiaphragm with basilar atelectasis. -  Lower thoracic compression deformity of indeterminate age is adjacent to a previously treated vertebral body with bone cement. MRI can be performed to further delineate.  Assessment & Plan by Problem:  Ms. Berhow is a 76 yo F with a history of CKD 3, Hip fracture, HTN, GERD, Generalized anxiety disorder, depression and Lumbar radiculopathy who presented from her PCP after having a syncopal episode earlier in the morning.  Syncope: Patient presented following an episode of syncope in the shower. She reported a rapid heart rate prior to the episode. BMP showed K of 2.7 and EKG showed prolonged QT. Differential includes orthostatic hypertension (consistent with recent history of light headedness upon standing); Arrhythmia (prolonged QT, felling of rapid heart rate prior to event, hypokalemia), MI (nausea following event, new t-wave inversions). - Cardiac Monitoring -  Repeat EKG - Orthostatic Vitals - Echocardiogram - BMP and Mg tonight - CBC and BMP in AM - Trend troponins  HTN: On lisinopril-HCTZ 20-10m Daily - Lisinopril 229mDaily - HCTZ 2514maily  GERD: On Omeprazole 64m64mily - Protonix 40mg22mly  Chronic neck and shoulder pain: On tramadol and robaxin - Hold home meds for now  GAD, Depression: On Clonazepam 1mg B61mPRN, Escitalopram 64mg D68m - Hold home meds for now  FEN: NS @ 75mL/hr11mgular Diet DVT Prophylaxis: Enoxaparin 40mg Cod75matus: Full  Dispo: Admit patient to Observation with expected length of stay less than 2 midnights.  Signed: Melvin, ANeva Seat/2018, 8:58 PM  Pager: [phone]

## 2016-12-15 NOTE — ED Notes (Signed)
Meal tray delivered.

## 2016-12-16 ENCOUNTER — Encounter (HOSPITAL_COMMUNITY): Payer: Self-pay

## 2016-12-16 ENCOUNTER — Observation Stay (HOSPITAL_BASED_OUTPATIENT_CLINIC_OR_DEPARTMENT_OTHER): Payer: PPO

## 2016-12-16 DIAGNOSIS — I129 Hypertensive chronic kidney disease with stage 1 through stage 4 chronic kidney disease, or unspecified chronic kidney disease: Secondary | ICD-10-CM | POA: Diagnosis not present

## 2016-12-16 DIAGNOSIS — G8929 Other chronic pain: Secondary | ICD-10-CM | POA: Diagnosis not present

## 2016-12-16 DIAGNOSIS — E876 Hypokalemia: Secondary | ICD-10-CM | POA: Diagnosis present

## 2016-12-16 DIAGNOSIS — M25519 Pain in unspecified shoulder: Secondary | ICD-10-CM | POA: Diagnosis not present

## 2016-12-16 DIAGNOSIS — Z23 Encounter for immunization: Secondary | ICD-10-CM | POA: Diagnosis not present

## 2016-12-16 DIAGNOSIS — M5416 Radiculopathy, lumbar region: Secondary | ICD-10-CM | POA: Diagnosis not present

## 2016-12-16 DIAGNOSIS — R9431 Abnormal electrocardiogram [ECG] [EKG]: Secondary | ICD-10-CM | POA: Diagnosis not present

## 2016-12-16 DIAGNOSIS — I5030 Unspecified diastolic (congestive) heart failure: Secondary | ICD-10-CM

## 2016-12-16 DIAGNOSIS — Z8249 Family history of ischemic heart disease and other diseases of the circulatory system: Secondary | ICD-10-CM | POA: Diagnosis not present

## 2016-12-16 DIAGNOSIS — N183 Chronic kidney disease, stage 3 (moderate): Secondary | ICD-10-CM | POA: Diagnosis not present

## 2016-12-16 DIAGNOSIS — F411 Generalized anxiety disorder: Secondary | ICD-10-CM | POA: Diagnosis not present

## 2016-12-16 DIAGNOSIS — K219 Gastro-esophageal reflux disease without esophagitis: Secondary | ICD-10-CM | POA: Diagnosis not present

## 2016-12-16 DIAGNOSIS — M542 Cervicalgia: Secondary | ICD-10-CM | POA: Diagnosis not present

## 2016-12-16 DIAGNOSIS — R55 Syncope and collapse: Secondary | ICD-10-CM | POA: Diagnosis not present

## 2016-12-16 DIAGNOSIS — K210 Gastro-esophageal reflux disease with esophagitis: Secondary | ICD-10-CM | POA: Diagnosis not present

## 2016-12-16 DIAGNOSIS — I480 Paroxysmal atrial fibrillation: Secondary | ICD-10-CM | POA: Diagnosis not present

## 2016-12-16 DIAGNOSIS — F329 Major depressive disorder, single episode, unspecified: Secondary | ICD-10-CM | POA: Diagnosis not present

## 2016-12-16 LAB — BASIC METABOLIC PANEL
Anion gap: 10 (ref 5–15)
BUN: 16 mg/dL (ref 6–20)
CALCIUM: 8.8 mg/dL — AB (ref 8.9–10.3)
CO2: 24 mmol/L (ref 22–32)
Chloride: 104 mmol/L (ref 101–111)
Creatinine, Ser: 0.95 mg/dL (ref 0.44–1.00)
GFR calc Af Amer: >60 mL/min (ref >60)
GFR, EST NON AFRICAN AMERICAN: 58 mL/min — AB (ref >60)
GLUCOSE: 118 mg/dL — AB (ref 65–99)
Potassium: 3.3 mmol/L — ABNORMAL LOW (ref 3.5–5.1)
Sodium: 138 mmol/L (ref 135–145)

## 2016-12-16 LAB — MAGNESIUM: Magnesium: 2.4 mg/dL (ref 1.7–2.4)

## 2016-12-16 LAB — ECHOCARDIOGRAM COMPLETE
AOASC: 32 cm
CHL CUP MV DEC (S): 264
CHL CUP TV REG PEAK VELOCITY: 225 cm/s
EWDT: 264 ms
FS: 26 % — AB (ref 28–44)
Height: 67 in
IVS/LV PW RATIO, ED: 1.13
LA ID, A-P, ES: 36 mm
LA diam end sys: 36 mm
LA diam index: 1.84 cm/m2
LA vol A4C: 35 ml
LAVOL: 34.6 mL
LAVOLIN: 17.6 mL/m2
LV PW d: 8 mm — AB (ref 0.6–1.1)
LV TDI E'MEDIAL: 8.38
LV e' LATERAL: 8.38 cm/s
Lateral S' vel: 13.7 cm/s
MV pk E vel: 0.9 m/s
TAPSE: 19.9 mm
TDI e' lateral: 8.38
TR max vel: 225 cm/s
WEIGHTICAEL: 2817.6 [oz_av]

## 2016-12-16 LAB — CBC
HEMATOCRIT: 33.7 % — AB (ref 36.0–46.0)
Hemoglobin: 11.5 g/dL — ABNORMAL LOW (ref 12.0–15.0)
MCH: 31.7 pg (ref 26.0–34.0)
MCHC: 34.1 g/dL (ref 30.0–36.0)
MCV: 92.8 fL (ref 78.0–100.0)
Platelets: 266 10*3/uL (ref 150–400)
RBC: 3.63 MIL/uL — ABNORMAL LOW (ref 3.87–5.11)
RDW: 13 % (ref 11.5–15.5)
WBC: 7.7 10*3/uL (ref 4.0–10.5)

## 2016-12-16 LAB — TROPONIN I: Troponin I: <0.03 ng/mL (ref <0.03)

## 2016-12-16 MED ORDER — POTASSIUM CHLORIDE CRYS ER 20 MEQ PO TBCR
40.0000 meq | EXTENDED_RELEASE_TABLET | Freq: Once | ORAL | Status: AC
  Administered 2016-12-16: 40 meq via ORAL
  Filled 2016-12-16: qty 2

## 2016-12-16 MED ORDER — INFLUENZA VAC SPLIT HIGH-DOSE 0.5 ML IM SUSY
0.5000 mL | PREFILLED_SYRINGE | INTRAMUSCULAR | Status: AC
  Administered 2016-12-16: 0.5 mL via INTRAMUSCULAR
  Filled 2016-12-16: qty 0.5

## 2016-12-16 NOTE — Care Management Note (Signed)
Case Management Note  Patient Details  Name: Jamie Buckley MRN: [id number] Date of Birth: September 26, 1940  Subjective/Objective:    Fall, weakness                Action/Plan: Discharge Planning: NCM spoke to pt and states her daughter lives next door. She has a Environmental consultant, bedside commode, four prong cane and wheelchair at home. States she has experienced several falls this year. NCM encouraged pt to discuss with her PCP and see if PT outpt or HH PT would benefit. Pt will contact PCP to arrange follow up appt.  PCP Laqueta Due MD   Expected Discharge Date:  12/16/2016              Expected Discharge Plan:  Home/Self Care  In-House Referral:  NA  Discharge planning Services  CM Consult  Post Acute Care Choice:  NA, Home Health Choice offered to:  NA  DME Arranged:  N/A DME Agency:  NA  HH Arranged:  NA HH Agency:  NA  Status of Service:  Completed, signed off  If discussed at Long Length of Stay Meetings, dates discussed:    Additional Comments:  Elliot Cousin, RN 12/16/2016, 10:16 AM

## 2016-12-16 NOTE — Progress Notes (Signed)
Jamie Buckley to be D/C'd home per MD order.  Discussed with the patient and all questions fully answered.  VSS, Skin clean, dry and intact without evidence of skin break down, no evidence of skin tears noted. IV catheter discontinued intact. Site without signs and symptoms of complications. Dressing and pressure applied.  An After Visit Summary was printed and given to the patient. Patient received prescription.  D/c education completed with patient/family including follow up instructions, medication list, d/c activities limitations if indicated, with other d/c instructions as indicated by MD - patient able to verbalize understanding, all questions fully answered.   Patient instructed to return to ED, call 911, or call MD for any changes in condition.   Patient escorted via WC, and D/C home via private auto.  Evern Bio 12/16/2016 4:44 PM

## 2016-12-16 NOTE — Progress Notes (Signed)
  Echocardiogram 2D Echocardiogram has been performed.  Jamie Buckley 12/16/2016, 2:21 PM

## 2016-12-16 NOTE — Discharge Summary (Signed)
Name: Jamie Buckley MRN: [id number] DOB: Mar 30, 1940 76 y.o. PCP: [name], MD  Date of Admission: 12/15/2016  1:29 PM Date of Discharge: 12/16/2016 Attending Physician: [name], MD  Discharge Diagnosis:  1. Syncope 2. Hypokalemia  Discharge Medications: Allergies as of 12/16/2016      Reactions   Morphine And Related Other (See Comments)   Patient reports have an "unpleasant experience" with it (mental status change??)   Dilaudid [hydromorphone Hcl] Anxiety   Hallucinations       Medication List    STOP taking these medications   HYDROcodone-acetaminophen 5-325 MG tablet Commonly known as:  NORCO   lisinopril-hydrochlorothiazide 20-25 MG tablet Commonly known as:  PRINZIDE,ZESTORETIC     TAKE these medications   aspirin EC 81 MG tablet Take 81 mg by mouth daily. What changed:  Another medication with the same name was removed. Continue taking this medication, and follow the directions you see here.   CALCIUM/C/D PO Take 1 tablet by mouth daily.   cholecalciferol 1000 units tablet Commonly known as:  VITAMIN D Take 1,000 Units by mouth daily.   clonazePAM 2 MG tablet Commonly known as:  KLONOPIN Take 1 mg by mouth 2 (two) times daily as needed for anxiety.   escitalopram 20 MG tablet Commonly known as:  LEXAPRO Take 20 mg by mouth daily.   methocarbamol 750 MG tablet Commonly known as:  ROBAXIN Take 750 mg by mouth 4 (four) times daily as needed for muscle spasms.   omeprazole 20 MG capsule Commonly known as:  PRILOSEC Take 20 mg by mouth daily.   ONE-A-DAY WOMENS 50+ ADVANTAGE Tabs Take 1 tablet by mouth daily.   traMADol 50 MG tablet Commonly known as:  ULTRAM Take 50 mg by mouth every 6 (six) hours as needed (for pain).   VIACTIV 500-500-40 MG-UNT-MCG Chew Generic drug:  Calcium-Vitamin D-Vitamin K Chew 3 tablets by mouth daily.       Disposition and follow-up:   Ms.Jamie Buckley was discharged from [facility] in Good condition.  At the hospital follow up visit please address:  1. Syncope: Monitor for resolution or return of symptoms. Consider rechecking orthostatic vitals.  2. Hypokalemia: Consider BMP.  3. HTN: Reevaluate blood pressure and adjust medication regimen as needed. Suggest avoiding thiazides.  4.  Labs / imaging needed at time of follow-up: Consider BMP.  5.  Pending labs/ test needing follow-up: None  Follow-up Appointments: Follow-up Information    [name], MD. Call in 2 day(s).   Specialty:  Internal Medicine Why:  Please call your primary care provider and schedule a follow up appointment in the next 1-2 weeks. Contact information: [address] [place] [address] [phone]           Hospital Course by problem list:   1. Syncope: Patient presented following an episode of syncope in the shower. She reported a rapid heart rate prior to the episode. She reported she had been drinking less fluids recently. Laboratory workup significant for K of 2.7 and EKG showed prolonged QT. Syncope was suspected to be 2/2 orthostatic hypertension considering a recent history of light headedness upon standing, positive orthostatic vitals, symptom improvement with fluids, and recent initiation of antihypertensive medications. New t-wave inversion on initial EKG decreased on repeat EKG and QT prolongation resolved. Troponin negative x 4. Antihypertensives discontinued at discharge.  2. Hypokalemia: Patient presented with K 2.7 and Mg 1.1. Patient recently started on lisinopril-HCTZ combination  pill, which was suspected be the primary etiology. Following initial repletion, K improved to 3.3 and Mg to 2.4. Additional 40 mEq Potassium PO was given. lisinopril-HCTZ discontinued at discharge.  Discharge Vitals:   BP (!) 135/58 (BP Location: Right Arm)   Pulse 70   Temp 98.5 F (36.9 C) (Oral)   Resp 16   Ht  (1.702 m)   Wt 176 lb 1.6 oz (79.9 kg)    SpO2 98%   BMI 27.58 kg/m   Pertinent Labs, Studies, and Procedures:  CBC Latest Ref Rng & Units 12/16/2016 12/15/2016 08/01/2016  WBC 4.0 - 10.5 K/uL 7.7 9.5 7.8  Hemoglobin 12.0 - 15.0 g/dL 11.5(L) 14.0 9.9(L)  Hematocrit 36.0 - 46.0 % 33.7(L) 39.2 30.2(L)  Platelets 150 - 400 K/uL 266 285 246   BMP Latest Ref Rng & Units 12/16/2016 12/15/2016 12/15/2016  Glucose 65 - 99 mg/dL 469(G) 295(M) 841(L)  BUN 6 - 20 mg/dL Creatinine 0.44 - 1.00 mg/dL 2.44 0.10(U) 7.25(D)  Sodium 135 - 145 mmol/L 138 136 137  Potassium 3.5 - 5.1 mmol/L 3.3(L) 3.3(L) 2.7(LL)  Chloride 101 - 111 mmol/L 104 103 101  CO2 22 - 32 mmol/L Calcium 8.9 - 10.3 mg/dL 6.6(Y) 4.0(H) 9.6   EKG: Sinus rhythm Borderline left axis deviation Anteroseptal infarct, age indeterminate Lateral leads are also involved Prolonged QT interval T wave changes in inferior leads and anterolateral leads are new since 5/18  CXR: IMPRESSION: - Elevation of the right hemidiaphragm with basilar atelectasis. - Lower thoracic compression deformity of indeterminate age is adjacent to a previously treated vertebral body with bone cement. MRI can be performed to further delineate.  Discharge Instructions: Discharge Instructions    Call MD for:  difficulty breathing, headache or visual disturbances    Complete by:  As directed    Call MD for:  persistant nausea and vomiting    Complete by:  As directed    Diet - low sodium heart healthy    Complete by:  As directed    Discharge instructions    Complete by:  As directed    Thank you for allowing [place] to care for you  You symptoms are believed to have been cause by low blood pressure, which worsened upon standing and may have been exacerbated by not drinking enough fluids while on new blood pressure medications.  Your medications have changed as below: - STOP taking Lisinopril-hydrochlorothizide (prinzide)  Please reevaluate your blood pressure and medications with  your PCP  Please schedule a follow up visit with your Primary Care Provider in the next 1-2.  Return to the ED if you experience return of severe symptoms   Increase activity slowly    Complete by:  As directed       Signed: [name], MD 12/16/2016, 2:50 PM   Pager: [phone]

## 2016-12-16 NOTE — Care Management Obs Status (Signed)
MEDICARE OBSERVATION STATUS NOTIFICATION   Patient Details  Name: Jamie Buckley MRN: [id number] Date of Birth: 07-17-1940   Medicare Observation Status Notification Given:  Yes    Elliot Cousin, RN 12/16/2016, 10:09 AM

## 2016-12-16 NOTE — Progress Notes (Signed)
   Subjective: Ms. Bias was seen resting comfortably in her bed this morning. She states that she feels much better and back to her baseline. She states she was able to get up and walk to the bathroom without any trouble. She was informed that her symptoms and her electrolyte abnormalities were likely due to her diuretic medications and that we would be stopping those medications and contacting her primary care provider about her stay. She was told that if the results of her workup come back negative she will likely be going home today. She states that she feels ready to go home.  Objective:  Vital signs in last 24 hours: Vitals:   12/15/16 1930 12/15/16 2002 12/15/16 2333 12/16/16 0412  BP: (!) 155/86 (!) 89/71 95/72 (!) 135/58  Pulse: 83 91  70  Resp: (!) Temp:  98.6 F (37 C)  98.5 F (36.9 C)  TempSrc:  Oral  Oral  SpO2: 99% 99%  98%  Weight:  177 lb 11.2 oz (80.6 kg)  176 lb 1.6 oz (79.9 kg)  Height:   (1.702 m)     Physical Exam  Constitutional: She is oriented to person, place, and time. She appears well-developed and well-nourished.  Eyes: EOM are normal. Right eye exhibits no discharge. Left eye exhibits no discharge.  Cardiovascular: Normal rate, regular rhythm and normal heart sounds.   Pulmonary/Chest: Effort normal and breath sounds normal. No respiratory distress.  Abdominal: Soft. Bowel sounds are normal. She exhibits no distension. There is no tenderness.  Musculoskeletal: She exhibits no edema or deformity.  Neurological: She is alert and oriented to person, place, and time.  Skin: Skin is warm and dry.  Psychiatric: She has a normal mood and affect.    Assessment/Plan:  Syncope: Patient presented following an episode of syncope in the shower. She reported a rapid heart rate prior to the episode. BMP showed K of 2.7 and EKG showed prolonged QT. Suspected to 2/2 orthostatic hypertension considering her recent history of light headedness upon  standing, positive orthostatic vitals and improvement with fluids, and recent initiation of antihypertensive medications. New t-wave inversion on initial EKG decreased on repeat EKG and QT prolongation resolved. Troponin negative x 4. Echo discontinued due low risk of cardiac etiology. - Cardiac Monitoring  Hypokalemia: Patient presented with K 2.7 and Mg 1.1. Patient recently started on HCTZ. Following initial repletion, K improved to 3.3 and Mg to 2.4.  - Additional 40 mEq Potassium PO - Discontinue HCTZ  HTN: On lisinopril-HCTZ 20-25mg  Daily. Blood pressure Normotensive to borderline hypertensive while admitted. - Hold home meds and discontinue at discharge  GERD: On Omeprazole  Daily - Protonix  Daily  Dispo: Anticipated discharge in approximately 0-1 day(s).   [name], MD 12/16/2016, 11:56 AM Pager: [phone]

## 2016-12-17 LAB — URINE CULTURE: Culture: NO GROWTH

## 2016-12-18 DIAGNOSIS — M5417 Radiculopathy, lumbosacral region: Secondary | ICD-10-CM | POA: Diagnosis not present

## 2016-12-18 DIAGNOSIS — L03114 Cellulitis of left upper limb: Secondary | ICD-10-CM | POA: Diagnosis not present

## 2016-12-18 DIAGNOSIS — F3341 Major depressive disorder, recurrent, in partial remission: Secondary | ICD-10-CM | POA: Diagnosis not present

## 2016-12-18 DIAGNOSIS — I10 Essential (primary) hypertension: Secondary | ICD-10-CM | POA: Diagnosis not present

## 2017-01-01 DIAGNOSIS — M1631 Unilateral osteoarthritis resulting from hip dysplasia, right hip: Secondary | ICD-10-CM | POA: Diagnosis not present

## 2017-02-01 DIAGNOSIS — M1631 Unilateral osteoarthritis resulting from hip dysplasia, right hip: Secondary | ICD-10-CM | POA: Diagnosis not present

## 2017-02-02 DIAGNOSIS — M545 Low back pain: Secondary | ICD-10-CM | POA: Diagnosis not present

## 2017-02-02 DIAGNOSIS — M25551 Pain in right hip: Secondary | ICD-10-CM | POA: Diagnosis not present

## 2017-02-09 DIAGNOSIS — M5417 Radiculopathy, lumbosacral region: Secondary | ICD-10-CM | POA: Diagnosis not present

## 2017-02-09 DIAGNOSIS — M461 Sacroiliitis, not elsewhere classified: Secondary | ICD-10-CM | POA: Diagnosis not present

## 2017-02-09 DIAGNOSIS — I10 Essential (primary) hypertension: Secondary | ICD-10-CM | POA: Diagnosis not present

## 2017-02-09 DIAGNOSIS — K219 Gastro-esophageal reflux disease without esophagitis: Secondary | ICD-10-CM | POA: Diagnosis not present

## 2017-05-20 ENCOUNTER — Other Ambulatory Visit: Payer: Self-pay | Admitting: Orthopedic Surgery

## 2017-05-20 DIAGNOSIS — Z9889 Other specified postprocedural states: Secondary | ICD-10-CM

## 2017-05-26 ENCOUNTER — Ambulatory Visit
Admission: RE | Admit: 2017-05-26 | Discharge: 2017-05-26 | Disposition: A | Discharge status: Home / Self Care | Payer: Commercial Managed Care - HMO | Source: Ambulatory Visit | Attending: Orthopedic Surgery | Admitting: Orthopedic Surgery

## 2017-05-26 DIAGNOSIS — Z9889 Other specified postprocedural states: Secondary | ICD-10-CM

## 2017-05-31 ENCOUNTER — Other Ambulatory Visit: Payer: Self-pay | Admitting: Orthopedic Surgery

## 2018-07-19 IMAGING — CT CT HIP*R* W/O CM
1 series · 15 of 32 positions shown, 19 images · non-contrast
Comparison: CT abdomen and pelvis from 10/03/2012

CLINICAL DATA: Right hip pain with limited range of motion x5
months. History of right hip fracture and surgery x1 year. Evaluate
fracture and screw placement.

EXAM:
CT OF THE RIGHT HIP WITHOUT CONTRAST
TECHNIQUE: Multidetector CT imaging of the right hip was performed according to
the standard protocol. Multiplanar CT image reconstructions were
also generated.

[Series 3: soft tissue pelvis/hip (person_name) · axial · 0.47mm/px · z∈[-313,-34]mm · 15 of 104 slices shown, 19 images]
[im 7/104  soft-tissue]
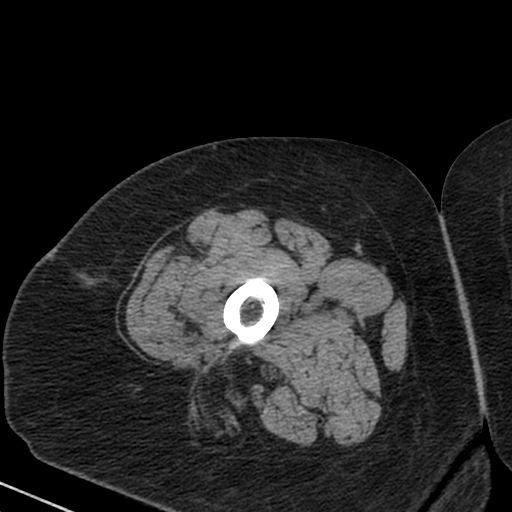
[im 7/104  bone]
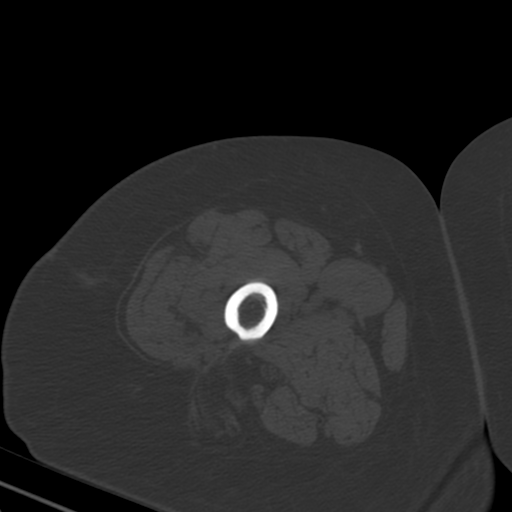
[im 14/104  soft-tissue]
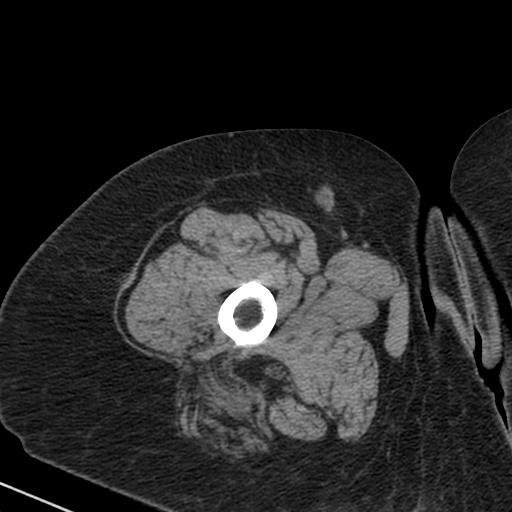
[im 20/104  soft-tissue]
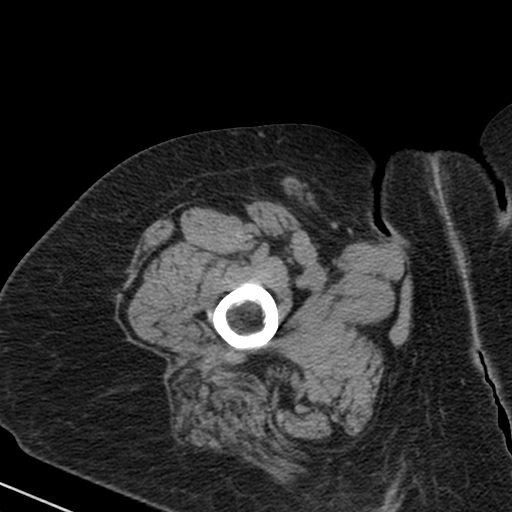
[im 30/104  soft-tissue]
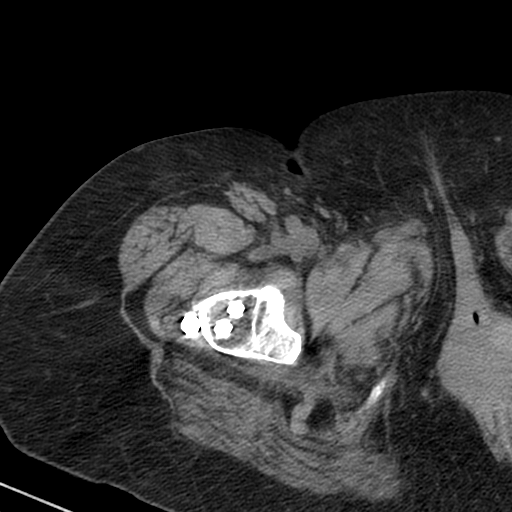
[im 37/104  soft-tissue]
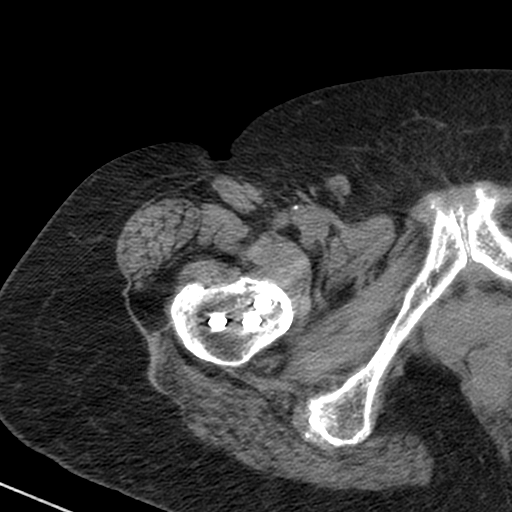
[im 44/104  soft-tissue]
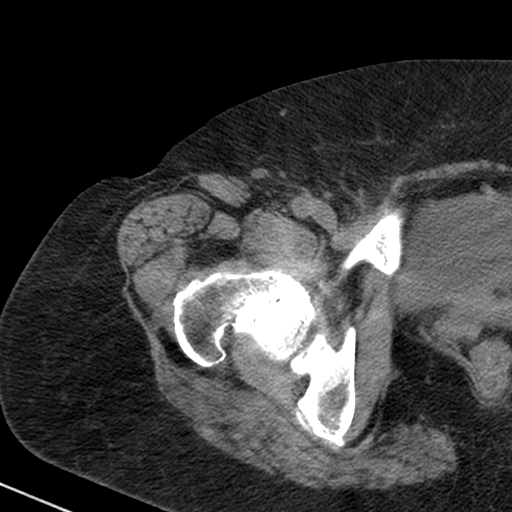
[im 54/104  soft-tissue]
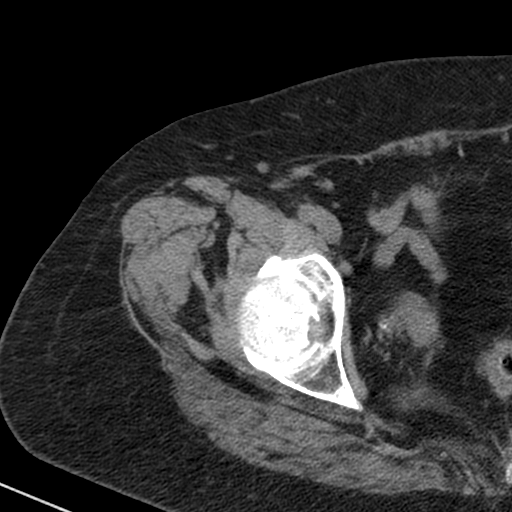
[im 60/104  soft-tissue]
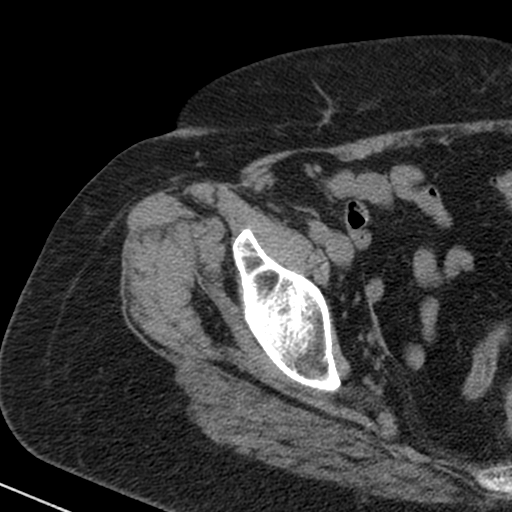
[im 67/104  soft-tissue]
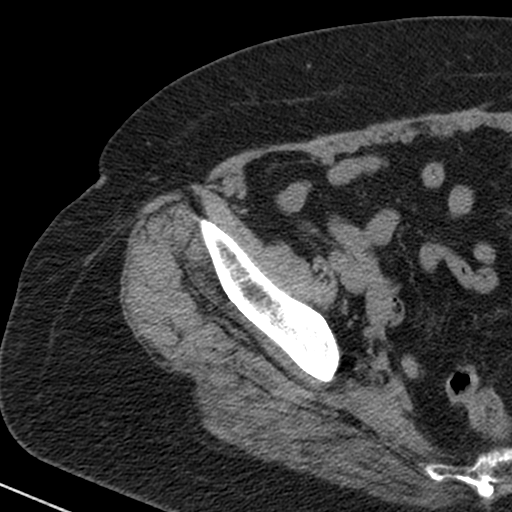
[im 67/104  bone]
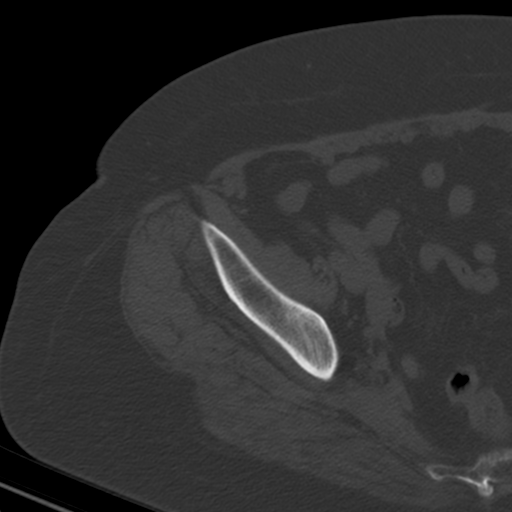
[im 74/104  soft-tissue]
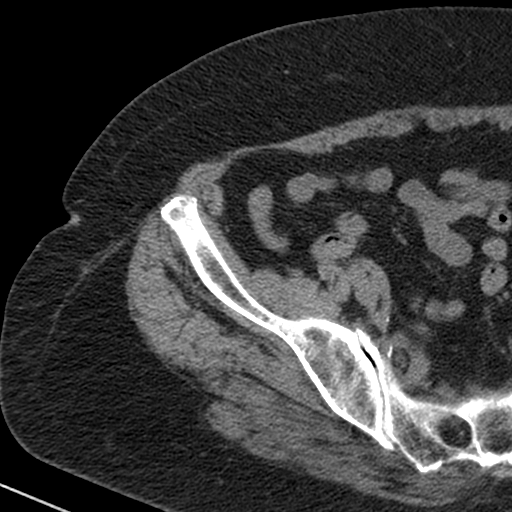
[im 84/104  soft-tissue]
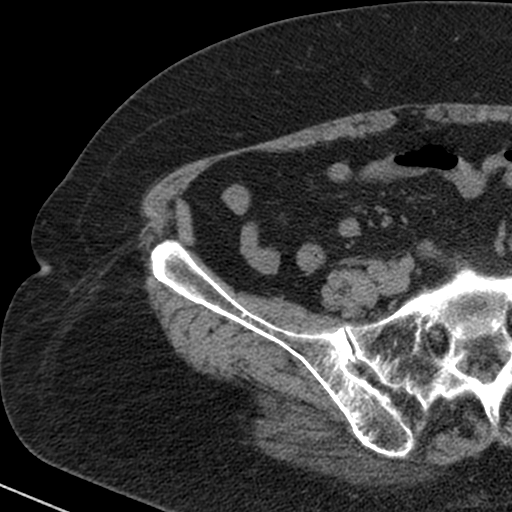
[im 90/104  soft-tissue]
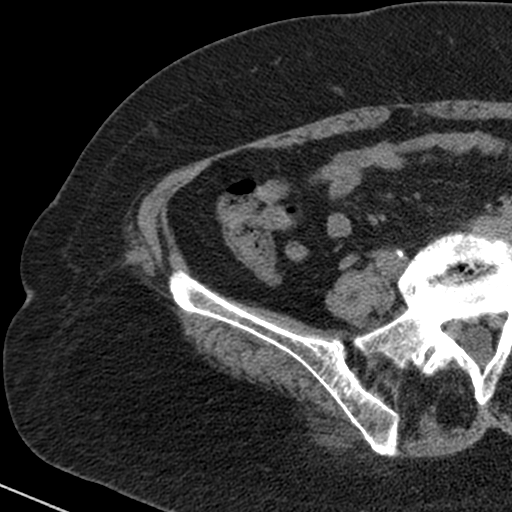
[im 90/104  lung]
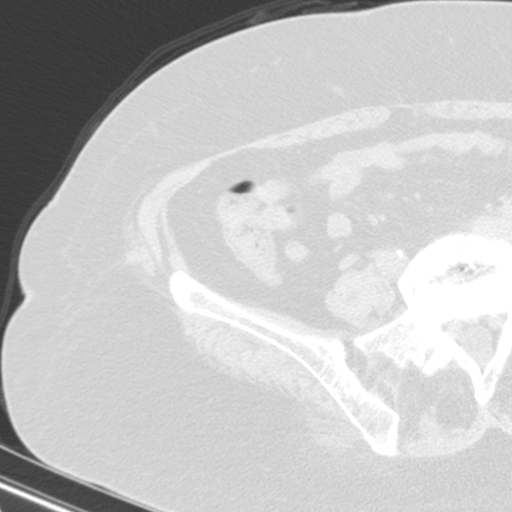
[im 94/104  lung]
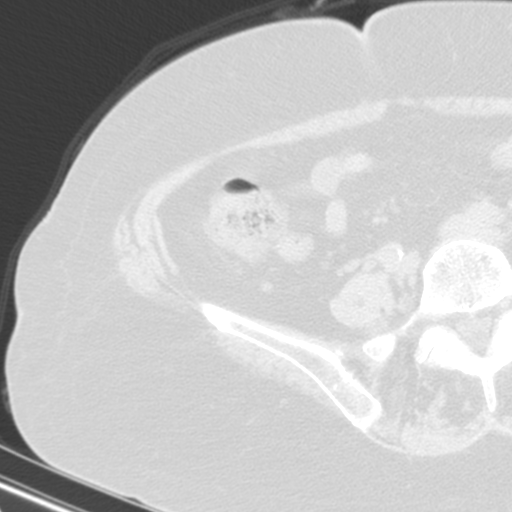
[im 97/104  soft-tissue]
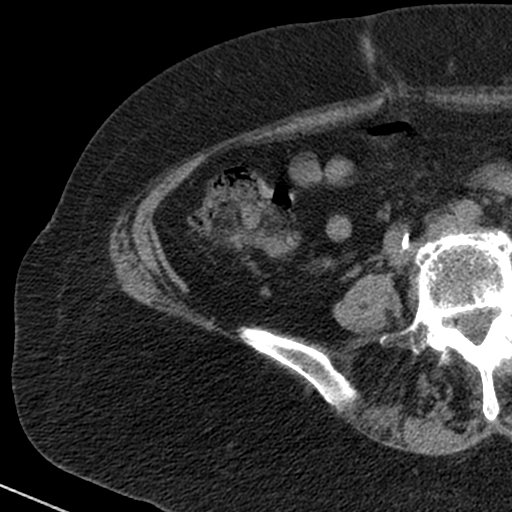
[im 97/104  lung]
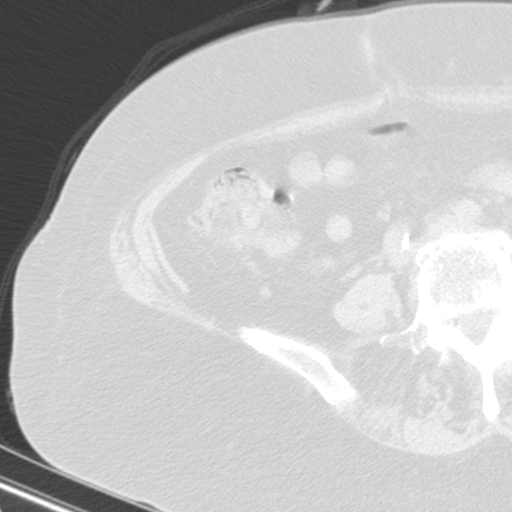
[im 100/104  lung]
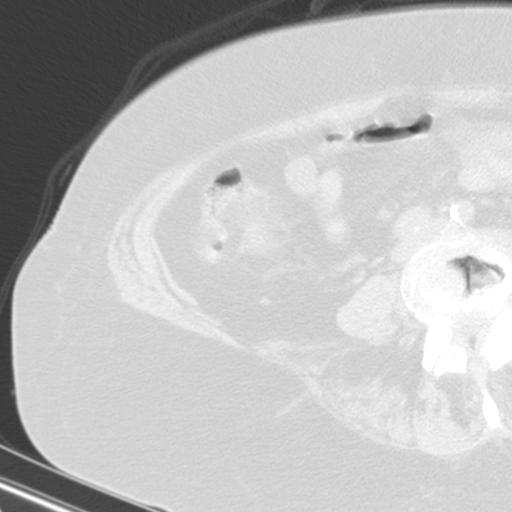

[15 of 32 positions shown; findings below may reference images not displayed]

FINDINGS: Bones/Joint/Cartilage

Three cannulated screws are noted along the long axis of the right
femoral neck with tips of the screws subcortical in position without
breech of the femoral head cortex. The head of the caudal most screw
comes closest to breaching the femoral head cortex, series 2, image
54 and is likely within 1 mm of the cortex. Varus and dorsal
angulated impacted fracture of the subcapital femoral neck is noted.
No hardware failure. No new fracture, joint effusion or suspicious
osseous lesions. Lower lumbar degenerative facet arthropathy and
disc disease is identified of the included lumbar spine from L4
through S1. Osteoarthritis of the adjacent right SI joint. The
included pelvis is intact.

Ligaments

Suboptimally assessed by CT.

Muscles and Tendons

No muscle atrophy, mass nor abnormal fluid collections.

Soft tissues

No bursal nor subcutaneous fluid collections. Small right inguinal
and anterior thigh lymph nodes.
IMPRESSION: 1. Three cannulated screws are identified status post pinning of a
valgus and dorsal angulated impacted subcapital right femoral neck
fracture. The heads the cannulated screws approach the cortex of the
femoral head without breech of the cortex, the most caudal screw on
the coronal reformats come within one mm of the cortex.
2. No new fracture, joint dislocation, effusion nor bone destruction
is identified.
3. Lower lumbar degenerative disc and facet arthropathy of the
included lumbar spine from L4 caudad.
